# Patient Record
Sex: Female | Born: 1975 | Race: White | Hispanic: Yes | Marital: Married | Smoking: Never smoker
Health system: Southern US, Community
[De-identification: ages and names within clinical notes are randomized; demographics above are authoritative.]

## PROBLEM LIST (undated history)

## (undated) DIAGNOSIS — C642 Malignant neoplasm of left kidney, except renal pelvis: Secondary | ICD-10-CM

## (undated) DIAGNOSIS — T8859XA Other complications of anesthesia, initial encounter: Secondary | ICD-10-CM

## (undated) DIAGNOSIS — T4145XA Adverse effect of unspecified anesthetic, initial encounter: Secondary | ICD-10-CM

## (undated) DIAGNOSIS — D500 Iron deficiency anemia secondary to blood loss (chronic): Secondary | ICD-10-CM

## (undated) DIAGNOSIS — N920 Excessive and frequent menstruation with regular cycle: Secondary | ICD-10-CM

## (undated) HISTORY — DX: Excessive and frequent menstruation with regular cycle: N92.0

## (undated) HISTORY — DX: Iron deficiency anemia secondary to blood loss (chronic): D50.0

## (undated) HISTORY — DX: Malignant neoplasm of left kidney, except renal pelvis: C64.2

---

## 2000-02-20 ENCOUNTER — Encounter: Payer: Self-pay | Admitting: *Deleted

## 2000-02-20 ENCOUNTER — Inpatient Hospital Stay (HOSPITAL_COMMUNITY): Admission: AD | Admit: 2000-02-20 | Discharge: 2000-02-23 | Payer: Self-pay | Admitting: *Deleted

## 2000-03-07 ENCOUNTER — Emergency Department (HOSPITAL_COMMUNITY): Admission: EM | Admit: 2000-03-07 | Discharge: 2000-03-07 | Payer: Self-pay | Admitting: Emergency Medicine

## 2000-03-09 ENCOUNTER — Inpatient Hospital Stay (HOSPITAL_COMMUNITY): Admission: AD | Admit: 2000-03-09 | Discharge: 2000-03-09 | Payer: Self-pay | Admitting: Obstetrics & Gynecology

## 2000-03-12 ENCOUNTER — Inpatient Hospital Stay (HOSPITAL_COMMUNITY): Admission: AD | Admit: 2000-03-12 | Discharge: 2000-03-12 | Payer: Self-pay | Admitting: Obstetrics

## 2000-03-15 ENCOUNTER — Inpatient Hospital Stay (HOSPITAL_COMMUNITY): Admission: AD | Admit: 2000-03-15 | Discharge: 2000-03-15 | Payer: Self-pay | Admitting: *Deleted

## 2000-03-16 ENCOUNTER — Encounter: Admission: RE | Admit: 2000-03-16 | Discharge: 2000-03-16 | Payer: Self-pay | Admitting: *Deleted

## 2001-09-17 ENCOUNTER — Ambulatory Visit (HOSPITAL_COMMUNITY): Admission: RE | Admit: 2001-09-17 | Discharge: 2001-09-17 | Payer: Self-pay | Admitting: *Deleted

## 2001-09-19 ENCOUNTER — Inpatient Hospital Stay (HOSPITAL_COMMUNITY): Admission: AD | Admit: 2001-09-19 | Discharge: 2001-09-19 | Payer: Self-pay | Admitting: *Deleted

## 2001-10-21 ENCOUNTER — Inpatient Hospital Stay (HOSPITAL_COMMUNITY): Admission: AD | Admit: 2001-10-21 | Discharge: 2001-10-22 | Payer: Self-pay | Admitting: Obstetrics and Gynecology

## 2005-07-28 ENCOUNTER — Inpatient Hospital Stay (HOSPITAL_COMMUNITY): Admission: AD | Admit: 2005-07-28 | Discharge: 2005-07-28 | Payer: Self-pay | Admitting: *Deleted

## 2005-12-08 ENCOUNTER — Inpatient Hospital Stay (HOSPITAL_COMMUNITY): Admission: AD | Admit: 2005-12-08 | Discharge: 2005-12-11 | Payer: Self-pay | Admitting: Family Medicine

## 2011-09-04 ENCOUNTER — Other Ambulatory Visit: Payer: Self-pay | Admitting: Family Medicine

## 2011-09-04 DIAGNOSIS — Z3046 Encounter for surveillance of implantable subdermal contraceptive: Secondary | ICD-10-CM

## 2011-09-12 ENCOUNTER — Other Ambulatory Visit: Payer: Self-pay

## 2011-09-12 ENCOUNTER — Ambulatory Visit
Admission: RE | Admit: 2011-09-12 | Discharge: 2011-09-12 | Disposition: A | Discharge status: Home / Self Care | Payer: No Typology Code available for payment source | Source: Ambulatory Visit | Attending: Family Medicine | Admitting: Family Medicine

## 2011-09-12 DIAGNOSIS — Z3046 Encounter for surveillance of implantable subdermal contraceptive: Secondary | ICD-10-CM

## 2016-11-18 ENCOUNTER — Encounter (HOSPITAL_COMMUNITY): Payer: Self-pay | Admitting: *Deleted

## 2016-11-18 ENCOUNTER — Ambulatory Visit: Payer: Self-pay | Admitting: Physician Assistant

## 2016-11-18 ENCOUNTER — Ambulatory Visit (HOSPITAL_COMMUNITY)
Admission: EM | Admit: 2016-11-18 | Discharge: 2016-11-18 | Disposition: A | Discharge status: Home / Self Care | Payer: Self-pay | Attending: Internal Medicine | Admitting: Internal Medicine

## 2016-11-18 DIAGNOSIS — R059 Cough, unspecified: Secondary | ICD-10-CM

## 2016-11-18 DIAGNOSIS — R05 Cough: Secondary | ICD-10-CM

## 2016-11-18 MED ORDER — HYDROCOD POLST-CPM POLST ER 10-8 MG/5ML PO SUER: 5.0000 mL | Freq: Two times a day (BID) | ORAL | 0 refills | Status: AC | PRN

## 2016-11-18 MED ORDER — AZITHROMYCIN 250 MG PO TABS: 250.0000 mg | ORAL_TABLET | Freq: Every day | ORAL | 0 refills | Status: DC

## 2016-11-18 NOTE — ED Provider Notes (Signed)
CSN: [id number]     Arrival date & time 11/18/16  1207 History   First MD Initiated Contact with Patient 11/18/16 1311     Chief Complaint  Patient presents with  . Cough   (Consider location/radiation/quality/duration/timing/severity/associated sxs/prior Treatment) Patient is a 41 year old female, presents today for a cough of one week's duration. Cough is occasionally productive. She also endorses shortness of breath at times. She has occasional chest pain due to coughing. She denies CP at rest. She also denies wheezing, sneezing, congestion, fever, sinus pain, ear pain, sore throat or sick exposure. Patient have try over-the-counter medications with no relief. She reports that she is getting worse.        History reviewed. No pertinent past medical history. Past Surgical History:  Procedure Laterality Date  . CESAREAN SECTION     History reviewed. No pertinent family history. Social History  Substance Use Topics  . Smoking status: Never Smoker  . Smokeless tobacco: Not on file  . Alcohol use No   OB History    No data available     Review of Systems  Constitutional: Positive for fatigue. Negative for chills and fever.  HENT: Negative for congestion, ear pain, rhinorrhea, sinus pain, sinus pressure, sneezing and tinnitus.   Respiratory: Positive for cough and shortness of breath. Negative for chest tightness and wheezing.   Cardiovascular: Positive for chest pain. Negative for palpitations.       Chest pain present during cough  Gastrointestinal: Negative for abdominal pain, diarrhea and nausea.  Neurological: Negative for dizziness and headaches.    Allergies  Patient has no known allergies.  Home Medications   Prior to Admission medications   Medication Sig Start Date End Date Taking? Authorizing Provider  azithromycin (ZITHROMAX) 250 MG tablet Take 1 tablet (250 mg total) by mouth daily. Take first 2 tablets together, then 1 every day until finished. 11/18/16    Barry Dienes, NP  chlorpheniramine-HYDROcodone [facility] PENNKINETIC ER) 10-8 MG/5ML SUER Take 5 mLs by mouth every 12 (twelve) hours as needed for cough. 11/18/16 11/25/16  Barry Dienes, NP   Meds Ordered and Administered this Visit  Medications - No data to display  BP (!) 151/79 (BP Location: Right Arm)   Pulse 75   Temp 98.9 F (37.2 C) (Oral)   Resp 18   LMP 11/18/2016   SpO2 99%  No data found.   Physical Exam  Constitutional: She is oriented to person, place, and time. She appears well-developed and well-nourished.  HENT:  Head: Normocephalic and atraumatic.  Right Ear: External ear normal.  Left Ear: External ear normal.  Nose: Nose normal.  Mouth/Throat: Oropharynx is clear and moist. No oropharyngeal exudate.  TM pearly gray with no erythema  Eyes: Pupils are equal, round, and reactive to light.  Neck: Normal range of motion. Neck supple.  Cardiovascular: Normal rate, regular rhythm and normal heart sounds.   Pulmonary/Chest: Effort normal and breath sounds normal.  Abdominal: Soft. Bowel sounds are normal. There is no tenderness.  Lymphadenopathy:    She has no cervical adenopathy.  Neurological: She is alert and oriented to person, place, and time.  Skin: Skin is warm and dry. No rash noted.  Nursing note and vitals reviewed.   Urgent Care Course     Procedures (including critical care time)  Labs Review Labs Reviewed - No data to display  Imaging Review No results found.   MDM   1. Cough    Most likely viral in etiology but patient  reports that she is getting worst. Will send home with prescription for Tussionex cough syrup and Z-pak to treat empirically. Informed on rest and hydration. Follow up with PCP for no improvement.     Barry Dienes, NP 11/18/16 1326

## 2016-11-18 NOTE — ED Triage Notes (Signed)
Pt reports  Cough  Body    Aches      Pain in  Chest   When  She    Coughs   Pt  Sates   She  Is  In   Good  Health  Takes  No  presription  meds

## 2016-11-18 NOTE — Discharge Instructions (Signed)
Take the medications as prescribed.  A lot of rest and hydration Follow up with your primary care doctor if you do not improve

## 2016-12-04 ENCOUNTER — Encounter: Payer: Self-pay | Admitting: Family Medicine

## 2016-12-04 ENCOUNTER — Ambulatory Visit: Payer: Self-pay | Attending: Family Medicine | Admitting: Family Medicine

## 2016-12-04 VITALS — BP 115/75 | HR 75 | Temp 98.3°F | Ht 66.0 in | Wt 239.4 lb

## 2016-12-04 DIAGNOSIS — R05 Cough: Secondary | ICD-10-CM | POA: Insufficient documentation

## 2016-12-04 DIAGNOSIS — R0982 Postnasal drip: Secondary | ICD-10-CM | POA: Insufficient documentation

## 2016-12-04 DIAGNOSIS — R1110 Vomiting, unspecified: Secondary | ICD-10-CM | POA: Insufficient documentation

## 2016-12-04 DIAGNOSIS — J029 Acute pharyngitis, unspecified: Secondary | ICD-10-CM | POA: Insufficient documentation

## 2016-12-04 MED ORDER — DEXTROMETHORPHAN-GUAIFENESIN 10-100 MG/5ML PO LIQD: 10.0000 mL | Freq: Four times a day (QID) | ORAL | 0 refills | Status: DC | PRN

## 2016-12-04 MED ORDER — CETIRIZINE HCL 10 MG PO TABS: 10.0000 mg | ORAL_TABLET | Freq: Every day | ORAL | 1 refills | Status: DC

## 2016-12-04 NOTE — Progress Notes (Signed)
Subjective:  Patient ID: Deborah Higgins, female    DOB: 1976-05-24  Age: 41 y.o. MRN: [id number]  CC: Cough   HPI Deborah Higgins presents For follow-up visit after seen in the urgent care one and half weeks ago for bronchitis. She completed a course of azithromycin and reports improvement in symptoms. She presents today with cough and posttussive vomiting for the last few days with associated dry and sore throat which is worse at night. She denies sinus tenderness or fevers; denies headaches.  Of note she has also had diarrhea of about 2 times a day for the last few days.   No past medical history on file.  Past Surgical History:  Procedure Laterality Date  . CESAREAN SECTION      No Known Allergies   Outpatient Medications Prior to Visit  Medication Sig Dispense Refill  . azithromycin (ZITHROMAX) 250 MG tablet Take 1 tablet (250 mg total) by mouth daily. Take first 2 tablets together, then 1 every day until finished. (Patient not taking: Reported on 12/04/2016) 6 tablet 0   No facility-administered medications prior to visit.     ROS Review of Systems  Constitutional: Negative for activity change, appetite change and fatigue.  HENT: Positive for sore throat. Negative for congestion and sinus pressure.   Eyes: Negative for visual disturbance.  Respiratory: Positive for cough. Negative for chest tightness, shortness of breath and wheezing.   Cardiovascular: Negative for chest pain and palpitations.  Gastrointestinal: Positive for diarrhea. Negative for abdominal distention, abdominal pain and constipation.  Endocrine: Negative for polydipsia.  Genitourinary: Negative for dysuria and frequency.  Musculoskeletal: Negative for arthralgias and back pain.  Skin: Negative for rash.  Neurological: Negative for tremors, light-headedness and numbness.  Hematological: Does not bruise/bleed easily.  Psychiatric/Behavioral: Negative for agitation and behavioral  problems.    Objective:  BP 115/75   Pulse 75   Temp 98.3 F (36.8 C) (Oral)   Wt 239 lb 6.4 oz (108.6 kg)   LMP 11/18/2016   SpO2 99%   BP/Weight 0/63/0160 1/0/9323  Systolic BP 557 322  Diastolic BP 75 79  Wt. (Lbs) 239.4 -      Physical Exam  Constitutional: She is oriented to person, place, and time. She appears well-developed and well-nourished.  HENT:  Right Ear: External ear normal.  Left Ear: External ear normal.  Postnasal drip in Oropharynx  Cardiovascular: Normal rate, normal heart sounds and intact distal pulses.   No murmur heard. Pulmonary/Chest: Effort normal and breath sounds normal. She has no wheezes. She has no rales. She exhibits no tenderness.  Abdominal: Soft. Bowel sounds are normal. She exhibits no distension and no mass. There is no tenderness.  Musculoskeletal: Normal range of motion.  Neurological: She is alert and oriented to person, place, and time.     Assessment & Plan:   1. Post-nasal drip - cetirizine (ZYRTEC) 10 MG tablet; Take 1 tablet (10 mg total) by mouth daily.  Dispense: 30 tablet; Refill: 1 - dextromethorphan-guaiFENesin (TUSSIN DM) 10-100 MG/5ML liquid; Take 10 mLs by mouth every 6 (six) hours as needed for cough.  Dispense: 240 mL; Refill: 0   Meds ordered this encounter  Medications  . cetirizine (ZYRTEC) 10 MG tablet    Sig: Take 1 tablet (10 mg total) by mouth daily.    Dispense:  30 tablet    Refill:  1  . dextromethorphan-guaiFENesin (TUSSIN DM) 10-100 MG/5ML liquid    Sig: Take 10 mLs by mouth every 6 (six) hours  as needed for cough.    Dispense:  240 mL    Refill:  0    Follow-up: Return if symptoms worsen or fail to improve.   This note has been created with Surveyor, quantity. Any transcriptional errors are unintentional.     Arnoldo Morale MD

## 2016-12-04 NOTE — Patient Instructions (Signed)

## 2016-12-08 ENCOUNTER — Ambulatory Visit: Payer: Self-pay | Attending: Family Medicine

## 2017-03-19 ENCOUNTER — Encounter: Payer: Self-pay | Admitting: Family Medicine

## 2017-05-28 ENCOUNTER — Ambulatory Visit: Payer: Self-pay | Attending: Family Medicine | Admitting: Family Medicine

## 2017-05-28 ENCOUNTER — Encounter: Payer: Self-pay | Admitting: Family Medicine

## 2017-05-28 VITALS — BP 128/78 | HR 72 | Temp 98.2°F | Ht 66.0 in | Wt 238.0 lb

## 2017-05-28 DIAGNOSIS — Z124 Encounter for screening for malignant neoplasm of cervix: Secondary | ICD-10-CM

## 2017-05-28 DIAGNOSIS — Z9889 Other specified postprocedural states: Secondary | ICD-10-CM | POA: Insufficient documentation

## 2017-05-28 DIAGNOSIS — Z0000 Encounter for general adult medical examination without abnormal findings: Secondary | ICD-10-CM

## 2017-05-28 DIAGNOSIS — Z1239 Encounter for other screening for malignant neoplasm of breast: Secondary | ICD-10-CM

## 2017-05-28 DIAGNOSIS — Z13228 Encounter for screening for other metabolic disorders: Secondary | ICD-10-CM

## 2017-05-28 DIAGNOSIS — Z1231 Encounter for screening mammogram for malignant neoplasm of breast: Secondary | ICD-10-CM

## 2017-05-28 NOTE — Patient Instructions (Signed)
[place] (Health Maintenance, Female) Un estilo de vida saludable y los cuidados preventivos pueden favorecer considerablemente a la salud y Musician. Pregunte a su mdico cul es el cronograma de exmenes peridicos apropiado para usted. Esta es una buena oportunidad para consultarlo sobre cmo prevenir enfermedades y [place] sano. Adems de los controles, hay muchas otras cosas que puede hacer usted mismo. Los expertos han realizado numerosas investigaciones [employer] cambios en el estilo de vida y las medidas de prevencin que, [place], lo ayudarn a mantenerse sano. Solicite a su mdico ms informacin. EL PESO Y LA DIETA Consuma una dieta saludable.  Asegrese de [employer] verduras, frutas, productos lcteos de bajo contenido de [place] y Advertising account planner.  No consuma muchos alimentos de alto contenido de grasas slidas, azcares agregados o sal.  Realice actividad fsica con regularidad. Esta es una de las prcticas ms importantes que puede hacer por su salud. ? La Delorise Shiner de los adultos deben hacer ejercicio durante al menos 124mnutos por semana. El ejercicio debe aumentar la frecuencia cardaca y pActorla transpiracin (ejercicio de [place]. ? La mayora de los adultos tambin deben hacer ejercicios de elongacin al mToysRusveces a la semana. Agregue esto al su plan de ejercicio de intensidad moderada. Mantenga un peso saludable.  El ndice de masa corporal ([facility] es una medida que puede utilizarse para identificar posibles problemas de pEast [place]town Proporciona una estimacin de la grasa corporal basndose en el peso y la altura. Su mdico puede ayudarle a dRadiation protection practitionerISouth Endy a lScientist, forensico mTheatre managerun peso saludable.  Para las mujeres de 20aos o ms: ? Un IJohn R. Oishei Children'S Hospitalmenor de 18,5 se considera bajo peso. ? Un ICumberland County Hospitalentre 18,5 y 24,9 es normal. ? Un IPelham Medical Centerentre 25 y 29,9 se considera sobrepeso. ? Un IMC de 30 o ms se considera  obesidad. Observe los niveles de colesterol y lpidos en la sangre.  Debe comenzar a rEnglish as a second language teacherde lpidos y cResearch officer, trade unionen la sangre a los 20aos y luego repetirlos cada 516aos  Es posible que nAutomotive engineerlos niveles de colesterol con mayor frecuencia si: ? Sus niveles de lpidos y colesterol son altos. ? Es mayor de 527CWC ? Presenta un alto riesgo de padecer enfermedades cardacas. DETECCIN DE CNCER Cncer de pulmn  Se recomienda realizar exmenes de deteccin de cncer de pulmn a personas adultas entre 574y 892aos que estn en riesgo de dHorticulturist, commercialde pulmn por sus antecedentes de consumo de tabaco.  Se recomienda una tomografa computarizada de baja dosis de los pulmones todos los aos a las personas que: ? Fuman actualmente. ? Hayan dejado el hbito en algn momento en los ltimos 15aos. ? Hayan fumado durante 30aos un paquete diario. Un paquete-ao equivale a fumar un promedio de un paquete de cigarrillos diario durante un ao.  Los exmenes de deteccin anuales deben continuar hasta que hayan pasado 15aos desde que dej de fumar.  Ya no debern realizarse si tiene un problema de salud que le impida recibir tratamiento para eScience writerde pulmn. Cncer de mama  Practique la autoconciencia de la mama. Esto significa reconocer la apariencia normal de sus mamas y cmo las siente.  Tambin significa realizar autoexmenes regulares de lJohnson & Johnson Informe a su mdico sobre cualquier cambio, sin importar cun pequeo sea.  Si tiene entre 20 y 363aos, un mdico debe realizarle un examen clnico de las mamas como parte del examen regular de s[place] cada 1 a  3aos.  Si tiene 40aos o ms, debe realizarse un examen clnico de las mamas todos los aos. Tambin considere realizarse una radiografa de las mamas (mamografa) todos los aos.  Si tiene antecedentes familiares de cncer de mama, hable con su mdico para someterse a un estudio gentico.  Si  tiene alto riesgo de padecer cncer de mama, hable con su mdico para someterse a una resonancia magntica y una mamografa todos los aos.  La evaluacin del gen del cncer de mama (BRCA) se recomienda a mujeres que tengan familiares con cnceres relacionados con el BRCA. Los cnceres relacionados con el BRCA incluyen los siguientes: ? Mama. ? Ovario. ? Trompas. ? Cnceres de peritoneo.  Los resultados de la evaluacin determinarn la necesidad de asesoramiento gentico y de anlisis de BRCA1 y BRCA2. Cncer de cuello del tero El mdico puede recomendarle que se haga pruebas peridicas de deteccin de cncer de los rganos de la pelvis (ovarios, tero y vagina). Estas pruebas incluyen un examen plvico, que abarca controlar si se produjeron cambios microscpicos en la superficie del cuello del tero (prueba de Papanicolaou). Pueden recomendarle que se haga estas pruebas cada 3aos, a partir de los 21aos.  A las mujeres que tienen entre 30 y 65aos, los mdicos pueden recomendarles que se sometan a exmenes plvicos y pruebas de Papanicolaou cada 3aos, o a la prueba de Papanicolaou y el examen plvico en combinacin con estudios de deteccin del virus del papiloma humano (VPH) cada 5aos. Algunos tipos de VPH aumentan el riesgo de padecer cncer de cuello del tero. La prueba para la deteccin del VPH tambin puede realizarse a mujeres de cualquier edad cuyos resultados de la prueba de Papanicolaou no sean claros.  Es posible que otros mdicos no recomienden exmenes de deteccin a mujeres no embarazadas que se consideran sujetos de bajo riesgo de padecer cncer de pelvis y que no tienen sntomas. Pregntele al mdico si un examen plvico de deteccin es adecuado para usted.  Si ha recibido un tratamiento para el cncer cervical o una enfermedad que podra causar cncer, necesitar realizarse una prueba de Papanicolaou y controles durante al menos 20 aos de concluido el tratamiento. Si no se  ha hecho el Papanicolaou con regularidad, debern volver a evaluarse los factores de riesgo (como tener un nuevo compaero sexual), para determinar si debe realizarse los estudios nuevamente. Algunas mujeres sufren problemas mdicos que aumentan la probabilidad de contraer cncer de cuello del tero. En estos casos, el mdico podr indicar que se realicen controles y pruebas de Papanicolaou con ms frecuencia. Cncer colorrectal  Este tipo de cncer puede detectarse y a menudo prevenirse.  Por lo general, los estudios de rutina se deben comenzar a hacer a partir de los 50 aos y hasta los 75 aos.  Sin embargo, el mdico podr aconsejarle que lo haga antes, si tiene factores de riesgo para el cncer de colon.  Tambin puede recomendarle que use un kit de prueba para hallar sangre oculta en la materia fecal.  Es posible que se use una pequea cmara en el extremo de un tubo para examinar directamente el colon (sigmoidoscopia o colonoscopia) a fin de detectar formas tempranas de cncer colorrectal.  Los exmenes de rutina generalmente comienzan a los 50aos.  El examen directo del colon se debe repetir cada 5 a 10aos hasta los 75aos. Sin embargo, es posible que se realicen exmenes con mayor frecuencia, si se detectan formas tempranas de plipos precancerosos o pequeos bultos. Cncer de piel  Revise la piel   de la cabeza a los pies con regularidad.  Informe a su mdico si aparecen nuevos lunares o los que tiene se modifican, especialmente en su forma y color.  Tambin notifique al mdico si tiene un lunar que es ms grande que el tamao de una goma de lpiz.  Siempre use pantalla solar. Aplique pantalla solar de manera libre y repetida a lo largo del da.  Protjase usando mangas y pantalones largos, un sombrero de ala ancha y gafas para el sol, siempre que se encuentre en el exterior. ENFERMEDADES CARDACAS, DIABETES E HIPERTENSIN ARTERIAL  La hipertensin arterial causa  enfermedades cardacas y aumenta el riesgo de ictus. La hipertensin arterial es ms probable en los siguientes casos: ? Las personas que tienen la presin arterial en el extremo del rango normal (100-139/85-89 mm Hg). ? Las personas con sobrepeso u obesidad. ? Las personas afroamericanas.  Si usted tiene entre 18 y 39 aos, debe medirse la presin arterial cada 3 a 5 aos. Si usted tiene 40 aos o ms, debe medirse la presin arterial todos los aos. Debe medirse la presin arterial dos veces: una vez cuando est en un hospital o una clnica y la otra vez cuando est en otro sitio. Registre el promedio de las dos mediciones. Para controlar su presin arterial cuando no est en un hospital o una clnica, puede usar lo siguiente: ? Una mquina automtica para medir la presin arterial en una farmacia. ? Un monitor para medir la presin arterial en el hogar.  Si tiene entre 55 y 79 aos, consulte a su mdico si debe tomar aspirina para prevenir el ictus.  Realcese exmenes de deteccin de la diabetes con regularidad. Esto incluye la toma de una muestra de sangre para controlar el nivel de azcar en la sangre durante el ayuno. ? Si tiene un peso normal y un bajo riesgo de padecer diabetes, realcese este anlisis cada tres aos despus de los 45aos. ? Si tiene sobrepeso y un alto riesgo de padecer diabetes, considere someterse a este anlisis antes o con mayor frecuencia. PREVENCIN DE INFECCIONES HepatitisB  Si tiene un riesgo ms alto de contraer hepatitis B, debe someterse a un examen de deteccin de este virus. Se considera que tiene un alto riesgo de contraer hepatitis B si: ? Naci en un pas donde la hepatitis B es frecuente. Pregntele a su mdico qu pases son considerados de alto riesgo. ? Sus padres nacieron en un pas de alto riesgo y usted no recibi una vacuna que lo proteja contra la hepatitis B (vacuna contra la hepatitis B). ? Tiene VIH o sida. ? Usa agujas para inyectarse  drogas. ? Vive con alguien que tiene hepatitis B. ? Ha tenido sexo con alguien que tiene hepatitis B. ? Recibe tratamiento de hemodilisis. ? Toma ciertos medicamentos para el cncer, trasplante de rganos y afecciones autoinmunitarias. Hepatitis C  Se recomienda un anlisis de sangre para: ? Todos los que nacieron entre 1945 y 1965. ? Todas las personas que tengan un riesgo de haber contrado hepatitis C. Enfermedades de transmisin sexual (ETS).  Debe realizarse pruebas de deteccin de enfermedades de transmisin sexual (ETS), incluidas gonorrea y clamidia si: ? Es sexualmente activo y es menor de 24aos. ? Es mayor de 24aos, y el mdico le informa que corre riesgo de tener este tipo de infecciones. ? La actividad sexual ha cambiado desde que le hicieron la ltima prueba de deteccin y tiene un riesgo mayor de tener clamidia o gonorrea. Pregntele al mdico si usted   tiene riesgo.  Si no tiene el VIH, pero corre riesgo de infectarse por el virus, se recomienda tomar diariamente un medicamento recetado para evitar la infeccin. Esto se conoce como profilaxis previa a la exposicin. Se considera que est en riesgo si: ? Es [place] sexualmente y no [place] preservativos habitualmente o no conoce el estado del VIH de sus Advertising copywriter. ? Se inyecta drogas. ? Es [place] sexualmente con Ardelia Mems pareja que tiene VIH. Consulte a su mdico para saber si tiene un alto riesgo de infectarse por el VIH. Si opta por comenzar la profilaxis previa a la exposicin, primero debe realizarse anlisis de deteccin del VIH. Luego, le harn anlisis cada 34mses mientras est tomando los medicamentos para la profilaxis previa a la exposicin. [facility] Si es premenopusica y puede quedar e[place] solicite a su mdico asesoramiento previo a la concepcin.  Si puede quedar embarazada, tome 400 a [id number]([id number]) de cido fAnheuser-Busch  Si desea evitar el embarazo, hable con su mdico sobre el  control de la natalidad (anticoncepcin). OSTEOPOROSIS Y MENOPAUSIA  La osteoporosis es una enfermedad en la que los huesos pierden los minerales y la fuerza por el avance de la edad. El resultado pueden ser fracturas graves en los hSay[place] El riesgo de osteoporosis puede identificarse con uArdelia Memsprueba de densidad sea.  Si tiene 65aos o ms, o si est en riesgo de sufrir osteoporosis y fracturas, pregunte a su mdico si debe someterse a exmenes.  Consulte a su mdico si debe tomar un suplemento de calcio o de vitamina D para reducir el riesgo de osteoporosis.  La menopausia puede presentar ciertos sntomas fsicos y rGaffer  La terapia de reemplazo hormonal puede reducir algunos de estos sntomas y rGaffer Consulte a su mdico para saber si la terapia de reemplazo hormonal es conveniente para usted. INSTRUCCIONES PARA EL CUIDADO EN EL HOGAR  Realcese los estudios de rutina de la salud, dentales y de lPublic librarian  MBath  No consuma ningn producto que contenga tabaco, lo que incluye cigarrillos, tabaco de mHigher education careers advisero cPsychologist, sport and exercise  Si est embarazada, no beba alcohol.  Si est amamantando, reduzca el consumo de alcohol y la frecuencia con la que consume.  Si es mujer y no est embarazada limite el consumo de alcohol a no ms de 1 medida por da. Una medida equivale a 12onzas de cerveza, 5onzas de vino o 1onzas de bebidas alcohlicas de alta graduacin.  No consuma drogas.  No comparta agujas.  Solicite ayuda a su mdico si necesita apoyo o informacin para abandonar las drogas.  Informe a su mdico si a menudo se siente deprimido.  Notifique a su mdico si alguna vez ha sido vctima de abuso o si no se siente seguro en su hogar. Esta informacin no tiene cMarine scientistel consejo del mdico. Asegrese de hacerle al mdico cualquier pregunta que tenga. Document Released: 05/18/2011 Document Revised: 06/19/2014 Document Reviewed:  03/02/2015 Elsevier Interactive Patient Education  2[employer]

## 2017-05-28 NOTE — Progress Notes (Signed)
Subjective:  Patient ID: Deborah Higgins, female    DOB: 1975-09-08  Age: 41 y.o. MRN: [id number]  CC: annual exam  HPI Deborah Higgins presents for her annual physical exam.  History reviewed. No pertinent past medical history.  Past Surgical History:  Procedure Laterality Date  . CESAREAN SECTION      No Known Allergies   Outpatient Medications Prior to Visit  Medication Sig Dispense Refill  . cetirizine (ZYRTEC) 10 MG tablet Take 1 tablet (10 mg total) by mouth daily. (Patient not taking: Reported on 05/28/2017) 30 tablet 1  . dextromethorphan-guaiFENesin (TUSSIN DM) 10-100 MG/5ML liquid Take 10 mLs by mouth every 6 (six) hours as needed for cough. (Patient not taking: Reported on 05/28/2017) 240 mL 0   No facility-administered medications prior to visit.     ROS Review of Systems  Constitutional: Negative for activity change, appetite change and fatigue.  HENT: Negative for congestion, sinus pressure and sore throat.   Eyes: Negative for visual disturbance.  Respiratory: Negative for cough, chest tightness, shortness of breath and wheezing.   Cardiovascular: Negative for chest pain and palpitations.  Gastrointestinal: Negative for abdominal distention, abdominal pain and constipation.  Endocrine: Negative for polydipsia.  Genitourinary: Negative for dysuria and frequency.  Musculoskeletal: Negative for arthralgias and back pain.  Skin: Negative for rash.  Neurological: Negative for tremors, light-headedness and numbness.  Hematological: Does not bruise/bleed easily.  Psychiatric/Behavioral: Negative for agitation and behavioral problems.    Objective:  BP 128/78   Pulse 72   Temp 98.2 F (36.8 C) (Oral)   Ht 5' 6" (1.676 m)   Wt 238 lb (108 kg)   LMP 05/12/2017   SpO2 100%   BMI 38.41 kg/m   BP/Weight 05/28/2017 9/79/4801 11/14/5372  Systolic BP 827 078 675  Diastolic BP 78 75 79  Wt. (Lbs) 238 239.4 -  BMI 38.41 38.64 -      Physical  Exam  Constitutional: She is oriented to person, place, and time. She appears well-developed and well-nourished. No distress.  HENT:  Head: Normocephalic.  Right Ear: External ear normal.  Left Ear: External ear normal.  Nose: Nose normal.  Mouth/Throat: Oropharynx is clear and moist.  Eyes: Conjunctivae and EOM are normal. Pupils are equal, round, and reactive to light.  Neck: Normal range of motion. No JVD present.  Cardiovascular: Normal rate, regular rhythm, normal heart sounds and intact distal pulses. Exam reveals no gallop.  No murmur heard. Pulmonary/Chest: Effort normal and breath sounds normal. No respiratory distress. She has no wheezes. She has no rales. She exhibits no tenderness.  Abdominal: Soft. Bowel sounds are normal. She exhibits no distension and no mass. There is no tenderness.  Genitourinary:  Genitourinary Comments: External genitalia, vagina, cervix, adnexa-all normal  Musculoskeletal: Normal range of motion. She exhibits no edema or tenderness.  Neurological: She is alert and oriented to person, place, and time. She has normal reflexes.  Skin: Skin is warm and dry. She is not diaphoretic.  Psychiatric: She has a normal mood and affect.     Assessment & Plan:   1. Annual physical exam Counseled on 150 minutes of exercise every week, healthy eating, routine healthcare maintenance. Declines flu shot - HIV antibody (with reflex)  2. Screening for cervical cancer - Cytology - PAP [place]  3. Screening for breast cancer - MM Digital Screening; Future  4. Screening for metabolic disorder - QGB20+FEOF - Lipid panel   No orders of the defined types were placed in  this encounter.   Follow-up: Return in about 1 year (around 05/28/2018) for Annual physical exam.   Arnoldo Morale MD

## 2017-05-29 LAB — CMP14+EGFR
A/G RATIO: 1.2 (ref 1.2–2.2)
ALT: 9 IU/L (ref 0–32)
AST: 13 IU/L (ref 0–40)
Albumin: 4 g/dL (ref 3.5–5.5)
Alkaline Phosphatase: 79 IU/L (ref 39–117)
BUN/Creatinine Ratio: 20 (ref 9–23)
BUN: 12 mg/dL (ref 6–24)
Bilirubin Total: 0.4 mg/dL (ref 0.0–1.2)
CALCIUM: 8.9 mg/dL (ref 8.7–10.2)
CHLORIDE: 106 mmol/L (ref 96–106)
CO2: 22 mmol/L (ref 20–29)
Creatinine, Ser: 0.59 mg/dL (ref 0.57–1.00)
GFR calc Af Amer: 130 mL/min/{1.73_m2} (ref >59)
GFR, EST NON AFRICAN AMERICAN: 113 mL/min/{1.73_m2} (ref >59)
Globulin, Total: 3.3 g/dL (ref 1.5–4.5)
Glucose: 88 mg/dL (ref 65–99)
POTASSIUM: 4 mmol/L (ref 3.5–5.2)
Sodium: 141 mmol/L (ref 134–144)
Total Protein: 7.3 g/dL (ref 6.0–8.5)

## 2017-05-29 LAB — LIPID PANEL
CHOL/HDL RATIO: 3.6 ratio (ref 0.0–4.4)
Cholesterol, Total: 109 mg/dL (ref 100–199)
HDL: 30 mg/dL — AB (ref >39)
LDL Calculated: 61 mg/dL (ref 0–99)
TRIGLYCERIDES: 91 mg/dL (ref 0–149)
VLDL Cholesterol Cal: 18 mg/dL (ref 5–40)

## 2017-05-29 LAB — CYTOLOGY - PAP
Diagnosis: NEGATIVE
HPV: NOT DETECTED

## 2017-05-29 LAB — HIV ANTIBODY (ROUTINE TESTING W REFLEX): HIV Screen 4th Generation wRfx: NONREACTIVE

## 2017-05-31 ENCOUNTER — Telehealth: Payer: Self-pay

## 2017-05-31 NOTE — Telephone Encounter (Signed)
Pt was called and there is no VM set up to leave a message. 

## 2017-06-14 ENCOUNTER — Inpatient Hospital Stay (HOSPITAL_COMMUNITY)
Admission: EM | Admit: 2017-06-14 | Discharge: 2017-06-16 | DRG: 688 | Disposition: A | Discharge status: Home / Self Care | Payer: Medicaid Other | Attending: Internal Medicine | Admitting: Internal Medicine

## 2017-06-14 ENCOUNTER — Emergency Department (HOSPITAL_COMMUNITY): Payer: Medicaid Other

## 2017-06-14 ENCOUNTER — Other Ambulatory Visit: Payer: Self-pay

## 2017-06-14 ENCOUNTER — Inpatient Hospital Stay (HOSPITAL_COMMUNITY): Payer: Medicaid Other

## 2017-06-14 ENCOUNTER — Encounter (HOSPITAL_COMMUNITY): Payer: Self-pay | Admitting: *Deleted

## 2017-06-14 DIAGNOSIS — R197 Diarrhea, unspecified: Secondary | ICD-10-CM

## 2017-06-14 DIAGNOSIS — N2889 Other specified disorders of kidney and ureter: Secondary | ICD-10-CM | POA: Diagnosis present

## 2017-06-14 DIAGNOSIS — Z23 Encounter for immunization: Secondary | ICD-10-CM

## 2017-06-14 DIAGNOSIS — C642 Malignant neoplasm of left kidney, except renal pelvis: Principal | ICD-10-CM | POA: Diagnosis present

## 2017-06-14 DIAGNOSIS — D75839 Thrombocytosis, unspecified: Secondary | ICD-10-CM | POA: Diagnosis present

## 2017-06-14 DIAGNOSIS — R300 Dysuria: Secondary | ICD-10-CM | POA: Diagnosis present

## 2017-06-14 DIAGNOSIS — D473 Essential (hemorrhagic) thrombocythemia: Secondary | ICD-10-CM | POA: Diagnosis present

## 2017-06-14 DIAGNOSIS — D509 Iron deficiency anemia, unspecified: Secondary | ICD-10-CM | POA: Diagnosis present

## 2017-06-14 DIAGNOSIS — N920 Excessive and frequent menstruation with regular cycle: Secondary | ICD-10-CM | POA: Diagnosis present

## 2017-06-14 DIAGNOSIS — R112 Nausea with vomiting, unspecified: Secondary | ICD-10-CM | POA: Diagnosis present

## 2017-06-14 DIAGNOSIS — D72829 Elevated white blood cell count, unspecified: Secondary | ICD-10-CM | POA: Diagnosis present

## 2017-06-14 DIAGNOSIS — A084 Viral intestinal infection, unspecified: Secondary | ICD-10-CM | POA: Diagnosis present

## 2017-06-14 DIAGNOSIS — M545 Low back pain: Secondary | ICD-10-CM

## 2017-06-14 DIAGNOSIS — D649 Anemia, unspecified: Secondary | ICD-10-CM

## 2017-06-14 LAB — URINALYSIS, ROUTINE W REFLEX MICROSCOPIC
Bilirubin Urine: NEGATIVE
Bilirubin Urine: NEGATIVE
Glucose, UA: NEGATIVE mg/dL
Glucose, UA: NEGATIVE mg/dL
KETONES UR: NEGATIVE mg/dL
Ketones, ur: NEGATIVE mg/dL
Nitrite: NEGATIVE
Nitrite: NEGATIVE
PH: 5 (ref 5.0–8.0)
PROTEIN: 30 mg/dL — AB
Protein, ur: NEGATIVE mg/dL
Specific Gravity, Urine: 1.026 (ref 1.005–1.030)
Specific Gravity, Urine: 1.024 (ref 1.005–1.030)
pH: 5 (ref 5.0–8.0)

## 2017-06-14 LAB — CBC
HEMATOCRIT: 30.5 % — AB (ref 36.0–46.0)
HEMOGLOBIN: 7.9 g/dL — AB (ref 12.0–15.0)
MCH: 15.3 pg — AB (ref 26.0–34.0)
MCHC: 25.9 g/dL — AB (ref 30.0–36.0)
MCV: 59.1 fL — AB (ref 78.0–100.0)
Platelets: 618 10*3/uL — ABNORMAL HIGH (ref 150–400)
RBC: 5.16 MIL/uL — AB (ref 3.87–5.11)
RDW: 19.5 % — ABNORMAL HIGH (ref 11.5–15.5)
WBC: 16.2 10*3/uL — ABNORMAL HIGH (ref 4.0–10.5)

## 2017-06-14 LAB — DIFFERENTIAL
BASOS ABS: 0 10*3/uL (ref 0.0–0.1)
Basophils Relative: 0 %
EOS ABS: 0 10*3/uL (ref 0.0–0.7)
Eosinophils Relative: 0 %
LYMPHS PCT: 10 %
Lymphs Abs: 1.6 10*3/uL (ref 0.7–4.0)
MONO ABS: 0.5 10*3/uL (ref 0.1–1.0)
Monocytes Relative: 3 %
Neutro Abs: 14.1 10*3/uL — ABNORMAL HIGH (ref 1.7–7.7)
Neutrophils Relative %: 87 %

## 2017-06-14 LAB — COMPREHENSIVE METABOLIC PANEL
ALBUMIN: 4 g/dL (ref 3.5–5.0)
ALK PHOS: 86 U/L (ref 38–126)
ALT: 12 U/L — ABNORMAL LOW (ref 14–54)
ANION GAP: 10 (ref 5–15)
AST: 18 U/L (ref 15–41)
BUN: 20 mg/dL (ref 6–20)
CHLORIDE: 104 mmol/L (ref 101–111)
CO2: 21 mmol/L — AB (ref 22–32)
Calcium: 9.3 mg/dL (ref 8.9–10.3)
Creatinine, Ser: 0.8 mg/dL (ref 0.44–1.00)
GFR calc non Af Amer: >60 mL/min (ref >60)
GLUCOSE: 142 mg/dL — AB (ref 65–99)
POTASSIUM: 3.5 mmol/L (ref 3.5–5.1)
SODIUM: 135 mmol/L (ref 135–145)
Total Bilirubin: 0.4 mg/dL (ref 0.3–1.2)
Total Protein: 8.1 g/dL (ref 6.5–8.1)

## 2017-06-14 LAB — I-STAT BETA HCG BLOOD, ED (MC, WL, AP ONLY): I-stat hCG, quantitative: <5 m[IU]/mL (ref <5)

## 2017-06-14 LAB — IRON AND TIBC
Iron: 7 ug/dL — ABNORMAL LOW (ref 28–170)
Saturation Ratios: 1 % — ABNORMAL LOW (ref 10.4–31.8)
TIBC: 468 ug/dL — ABNORMAL HIGH (ref 250–450)
UIBC: 461 ug/dL

## 2017-06-14 LAB — LIPASE, BLOOD: LIPASE: 27 U/L (ref 11–51)

## 2017-06-14 LAB — FERRITIN: Ferritin: 4 ng/mL — ABNORMAL LOW (ref 11–307)

## 2017-06-14 MED ORDER — ONDANSETRON HCL 4 MG/2ML IJ SOLN
4.0000 mg | Freq: Once | INTRAMUSCULAR | Status: AC
  Administered 2017-06-14: 4 mg via INTRAVENOUS
  Filled 2017-06-14: qty 2

## 2017-06-14 MED ORDER — ONDANSETRON HCL 4 MG PO TABS: 4.0000 mg | ORAL_TABLET | Freq: Four times a day (QID) | ORAL | Status: DC | PRN

## 2017-06-14 MED ORDER — FOSFOMYCIN TROMETHAMINE 3 G PO PACK
3.0000 g | PACK | Freq: Once | ORAL | Status: AC
  Administered 2017-06-15: 3 g via ORAL
  Filled 2017-06-14: qty 3

## 2017-06-14 MED ORDER — LORAZEPAM 2 MG/ML IJ SOLN
1.0000 mg | Freq: Once | INTRAMUSCULAR | Status: AC
  Administered 2017-06-14: 1 mg via INTRAVENOUS
  Filled 2017-06-14: qty 1

## 2017-06-14 MED ORDER — SODIUM CHLORIDE 0.9% FLUSH: 3.0000 mL | INTRAVENOUS | Status: DC | PRN

## 2017-06-14 MED ORDER — SODIUM CHLORIDE 0.9% FLUSH
3.0000 mL | Freq: Two times a day (BID) | INTRAVENOUS | Status: DC
  Administered 2017-06-14 – 2017-06-16 (×2): 3 mL via INTRAVENOUS

## 2017-06-14 MED ORDER — ONDANSETRON 4 MG PO TBDP
8.0000 mg | ORAL_TABLET | Freq: Once | ORAL | Status: AC
  Administered 2017-06-14: 8 mg via ORAL

## 2017-06-14 MED ORDER — INFLUENZA VAC SPLIT QUAD 0.5 ML IM SUSY
0.5000 mL | PREFILLED_SYRINGE | INTRAMUSCULAR | Status: AC
  Administered 2017-06-15: 0.5 mL via INTRAMUSCULAR
  Filled 2017-06-14: qty 0.5

## 2017-06-14 MED ORDER — SODIUM CHLORIDE 0.9 % IV BOLUS (SEPSIS)
1000.0000 mL | Freq: Once | INTRAVENOUS | Status: AC
  Administered 2017-06-14: 1000 mL via INTRAVENOUS

## 2017-06-14 MED ORDER — ONDANSETRON HCL 4 MG/2ML IJ SOLN: 4.0000 mg | Freq: Four times a day (QID) | INTRAMUSCULAR | Status: DC | PRN

## 2017-06-14 MED ORDER — ACETAMINOPHEN 325 MG PO TABS
650.0000 mg | ORAL_TABLET | Freq: Once | ORAL | Status: AC
Start: 2017-06-14 — End: 2017-06-14
  Administered 2017-06-14: 650 mg via ORAL
  Filled 2017-06-14: qty 2

## 2017-06-14 MED ORDER — SODIUM CHLORIDE 0.9 % IV SOLN: 250.0000 mL | INTRAVENOUS | Status: DC | PRN

## 2017-06-14 MED ORDER — ENOXAPARIN SODIUM 40 MG/0.4ML ~~LOC~~ SOLN
40.0000 mg | SUBCUTANEOUS | Status: DC
  Administered 2017-06-14 – 2017-06-16 (×3): 40 mg via SUBCUTANEOUS
  Filled 2017-06-14 (×2): qty 0.4

## 2017-06-14 MED ORDER — GADOBENATE DIMEGLUMINE 529 MG/ML IV SOLN
20.0000 mL | Freq: Once | INTRAVENOUS | Status: AC | PRN
  Administered 2017-06-14: 20 mL via INTRAVENOUS

## 2017-06-14 NOTE — ED Provider Notes (Signed)
Deborah Higgins [facility] Provider Note   CSN: [id number] Arrival date & time: 06/14/17  0200     History   Chief Complaint Chief Complaint  Patient presents with  . Abdominal Pain    HPI Deborah Higgins is a 42 y.o. female who presents to the emergency department with a chief complaint of abdominal pain with associated dysuria, nausea, vomiting, and diarrhea that began suddenly around midnight. She reports 5-10 episodes of NBNB emesis and diarrhea.  She reports worsening burning and pressure-like dysuria over the last 3-4 days.  She denies fever, chills, dyspnea, back pain, chest pain, lightheadedness, dizziness, vaginal bleeding, itching, pain or discharge.   She endorses intermittent, achy abdominal pain that is only present when she feels the urge to have an episode of diarrhea.  No other alleviating factors.   LMP was 12/25-29. She reports an average of 3 pads daily during her last few menstrual cycles. No recent changes that she has noticed to the length of heaviness of her menstrual cycle.  She states that she was previously told that she has anemia by another doctor.  She was seen by her PCP in December and had a Pap smear and was tested for HIV, but no CBC was performed.  No sick contacts. Surgical hx includes cesarean section.  The history is provided by the patient and a relative. A language interpreter was used.    History reviewed. No pertinent past medical history.  Patient Active Problem List   Diagnosis Date Noted  . Mass of left kidney 06/14/2017  . Microcytic anemia 06/14/2017  . Leukocytosis 06/14/2017  . Thrombocytosis ([place]) 06/14/2017  . Nausea vomiting and diarrhea 06/14/2017    Past Surgical History:  Procedure Laterality Date  . CESAREAN SECTION      OB History    No data available       Home Medications    Prior to Admission medications   Not on File    Family History No family history on file.  Social History Social  History   Tobacco Use  . Smoking status: Never Smoker  . Smokeless tobacco: Never Used  Substance Use Topics  . Alcohol use: No  . Drug use: No     Allergies   Patient has no known allergies.   Review of Systems Review of Systems  Constitutional: Negative for activity change, chills and fever.  HENT: Negative for congestion.   Respiratory: Negative for shortness of breath.   Cardiovascular: Negative for chest pain.  Gastrointestinal: Positive for abdominal pain, diarrhea, nausea and vomiting.  Genitourinary: Positive for dysuria. Negative for decreased urine volume, urgency, vaginal discharge and vaginal pain.  Musculoskeletal: Negative for back pain, neck pain and neck stiffness.  Skin: Negative for rash.  Neurological: Negative for dizziness, syncope, weakness and headaches.  Psychiatric/Behavioral: Negative for confusion.   Physical Exam Updated Vital Signs BP (!) 115/44   Pulse 67   Temp 98.4 F (36.9 C) (Oral)   Resp 16   Ht 5\' 6"  (1.676 m)   Wt 108 kg (238 lb)   LMP 05/14/2017   SpO2 98%   BMI 38.41 kg/m   Physical Exam  Constitutional: No distress.  Obese abdomen.  HENT:  Head: Normocephalic.  Eyes: Conjunctivae are normal.  Neck: Neck supple.  Cardiovascular: Normal rate and regular rhythm. Exam reveals no gallop and no friction rub.  No murmur heard. Pulmonary/Chest: Effort normal and breath sounds normal. No stridor. No respiratory distress. She has no wheezes. She  has no rhonchi. She has no rales. She exhibits no tenderness.  Abdominal: Soft. Bowel sounds are normal. She exhibits no shifting dullness, no distension, no pulsatile liver, no fluid wave, no abdominal bruit, no ascites, no pulsatile midline mass and no mass. There is no hepatosplenomegaly, splenomegaly or hepatomegaly. There is generalized tenderness. There is no rigidity, no rebound, no guarding, no CVA tenderness, no tenderness at McBurney's point and negative Murphy's sign. No hernia.    Mild generalized tenderness to palpation to the abdomen.  Left-sided CVA tenderness.  No right CVA tenderness.  Negative Murphy sign.  No tenderness over McBurney's point.  No peritoneal signs.  Musculoskeletal: Normal range of motion. She exhibits no edema, tenderness or deformity.  Neurological: She is alert.  Skin: Skin is warm. Capillary refill takes less than 2 seconds. No rash noted.  Psychiatric: Her behavior is normal.  Nursing note and vitals reviewed.  ED Treatments / Results  Labs (all labs ordered are listed, but only abnormal results are displayed) Labs Reviewed  COMPREHENSIVE METABOLIC PANEL - Abnormal; Notable for the following components:      Result Value   CO2 21 (*)    Glucose, Bld 142 (*)    ALT 12 (*)    All other components within normal limits  CBC - Abnormal; Notable for the following components:   WBC 16.2 (*)    RBC 5.16 (*)    Hemoglobin 7.9 (*)    HCT 30.5 (*)    MCV 59.1 (*)    MCH 15.3 (*)    MCHC 25.9 (*)    RDW 19.5 (*)    Platelets 618 (*)    All other components within normal limits  URINALYSIS, ROUTINE W REFLEX MICROSCOPIC - Abnormal; Notable for the following components:   APPearance HAZY (*)    Hgb urine dipstick SMALL (*)    Protein, ur 30 (*)    Leukocytes, UA MODERATE (*)    Bacteria, UA RARE (*)    Squamous Epithelial / LPF 6-30 (*)    All other components within normal limits  URINALYSIS, ROUTINE W REFLEX MICROSCOPIC - Abnormal; Notable for the following components:   APPearance HAZY (*)    Hgb urine dipstick SMALL (*)    Leukocytes, UA TRACE (*)    Bacteria, UA RARE (*)    Squamous Epithelial / LPF 0-5 (*)    All other components within normal limits  DIFFERENTIAL - Abnormal; Notable for the following components:   Neutro Abs 14.1 (*)    All other components within normal limits  LIPASE, BLOOD  CBC  FERRITIN  IRON AND TIBC  I-STAT BETA HCG BLOOD, ED ([facility], [facility], [facility] ONLY)    EKG  EKG Interpretation None        Radiology US Renal  Result Date: 06/14/2017 CLINICAL DATA:  Left renal mass seen on CT EXAM: RENAL / URINARY TRACT ULTRASOUND COMPLETE COMPARISON:  CT 06/14/2017 FINDINGS: Right Kidney: Length: 12.2 cm. Echogenicity within normal limits. No mass or hydronephrosis visualized. Left Kidney: Length: 12.8 cm. Normal echotexture. No hydronephrosis. Complex cystic mass in the lower pole measures 4.1 x 3.4 x 3.5 cm. Thick internal septations noted. Bladder: Appears normal for degree of bladder distention. IMPRESSION: Complex cystic mass in the lower pole of the left kidney with thick internal septations a possible solid components. Cannot exclude cystic renal neoplasm. This requires further evaluation. Recommend renal MRI with and without contrast. Electronically Signed   By: Rolm Baptise M.D.   On: 06/14/2017  09:47   Ct Renal Stone Study  Result Date: 06/14/2017 CLINICAL DATA:  Acute lower abdominal pain, nausea, vomiting. EXAM: CT ABDOMEN AND PELVIS WITHOUT CONTRAST TECHNIQUE: Multidetector CT imaging of the abdomen and pelvis was performed following the standard protocol without IV contrast. COMPARISON:  None. FINDINGS: Lower chest: No acute abnormality. Hepatobiliary: No focal liver abnormality is seen. No gallstones, gallbladder wall thickening, or biliary dilatation. Pancreas: Unremarkable. No pancreatic ductal dilatation or surrounding inflammatory changes. Spleen: Normal in size without focal abnormality. Adrenals/Urinary Tract: Adrenal glands appear normal. No hydronephrosis or renal obstruction is noted. No renal or ureteral calculi are noted. Urinary bladder is unremarkable. 2.4 cm exophytic rounded abnormality is seen arising medially from lower pole of left kidney with average Hounsfield measurement of 22. It is uncertain if this represents hyperdense cyst or mass. Right kidney is unremarkable on this unenhanced image. Stomach/Bowel: Stomach is within normal limits. Appendix appears normal. No  evidence of bowel wall thickening, distention, or inflammatory changes. Vascular/Lymphatic: No significant vascular findings are present. No enlarged abdominal or pelvic lymph nodes. Reproductive: Probable enlarged fibroid uterus is noted. No adnexal abnormality is noted. Other: No abdominal wall hernia or abnormality. No abdominopelvic ascites. Musculoskeletal: No acute or significant osseous findings. IMPRESSION: No hydronephrosis or renal obstruction is noted. No renal or ureteral calculi are noted. 2.4 cm exophytic rounded abnormality is seen arising medially from lower pole of left kidney. It is uncertain if this represents hyperdense cyst or possible neoplasm. Further evaluation with ultrasound is recommended to rule out malignancy. Probable enlarged fibroid uterus. Electronically Signed   By: Marijo Conception, M.D.   On: 06/14/2017 08:32    Procedures Procedures (including critical care time)  Medications Ordered in ED Medications  enoxaparin (LOVENOX) injection 40 mg (not administered)  sodium chloride flush (NS) 0.9 % injection 3 mL (not administered)  sodium chloride flush (NS) 0.9 % injection 3 mL (not administered)  0.9 %  sodium chloride infusion (not administered)  ondansetron (ZOFRAN) tablet 4 mg (not administered)    Or  ondansetron (ZOFRAN) injection 4 mg (not administered)  ondansetron (ZOFRAN-ODT) disintegrating tablet 8 mg (8 mg Oral Given 06/14/17 0234)  sodium chloride 0.9 % bolus 1,000 mL (0 mLs Intravenous Stopped 06/14/17 1618)  LORazepam (ATIVAN) injection 1 mg (1 mg Intravenous Given 06/14/17 1156)  ondansetron (ZOFRAN) injection 4 mg (4 mg Intravenous Given 06/14/17 1155)  acetaminophen (TYLENOL) tablet 650 mg (650 mg Oral Given 06/14/17 1156)  gadobenate dimeglumine (MULTIHANCE) injection 20 mL (20 mLs Intravenous Contrast Given 06/14/17 1715)     Initial Impression / Assessment and Plan / ED Course  I have reviewed the triage vital signs and the nursing notes.  Pertinent  labs & imaging results that were available during my care of the patient were reviewed by me and considered in my medical decision making (see chart for details).     42 year old female presenting with a h/o of nephrolithiasis with NBNB emesis, nausea, diarrhea, abdominal pain that began last night and dysuria over the last week.  On physical exam, she has CVA tenderness, but the remainder of her abdominal exam is unremarkable. UA with hgb, but otherwise appears contaminated. CT stone study ordered to access for nephrolithiasis demonstrating a 2.4 cm exophytic rounded abnormality arisign from the lower pole of the left kidney. Radiology recommended further evaluation with [place] for concern of hyperdense cyst or possible neoplasms. [place] was unable to provide further evaluation and radiology recommended MRI with and without contrast.   CBC  with several hematologic abnormalities including: leukocytosis of 16.2, hgb 7.9, and thrombocytosis of 618. MCV 59.1 No CBC on file to compare with previous. Spoke at length with the patient using a [place] [facility] interpreter. She does endorse some worsening fatigue and mild dyspnea on exertion over the last month, concerning for symptomatic anemia. No recent change in the patient's menstrual cycle, which does not necessary explain the patient's hgb of 7.9- her cycle lasts approximately ~4 days with 3 pads per day.   She also has a thrombocytosis, possibly related to N/V/D if viral etiology, but I question if this elevation would be this high with onset in the last 12 hours. Thrombocytosis could be secondary to malignancy. There is also concern with this patient regarding follow up and care since the patient does not currently have health insurance and is followed at [facility].   The patient was discussed with Dr. Jeanell Sparrow, attending physician. Spoke with resident Charlynn Grimes with the internal medicine team who will admit the patient for further workup and evalation. Requested  differential to be added to CBC and MR order to be placed, which I have added. The patient appears reasonably stabilized for admission considering the current resources, flow, and capabilities available in the ED at this time, and I doubt any other [facility] requiring further screening and/or treatment in the ED prior to admission.   Final Clinical Impressions(s) / ED Diagnoses   Final diagnoses:  Symptomatic anemia  Left kidney mass    ED Discharge Orders    None       Joanne Gavel, PA-C 06/14/17 1823    Pattricia Boss, MD 06/15/17 1659

## 2017-06-14 NOTE — Progress Notes (Signed)
Report received from the ED. Pt arrived to the unit at 1802 via bed.

## 2017-06-14 NOTE — ED Notes (Signed)
Patient transported to Ultrasound 

## 2017-06-14 NOTE — ED Notes (Signed)
Attempted report 

## 2017-06-14 NOTE — ED Triage Notes (Signed)
The pt is c/o abd pain with n v and diarhea for 2 hours  lmp last month

## 2017-06-14 NOTE — H&P (Signed)
Date: 06/14/2017               Patient Name:  Deborah Higgins MRN: [id number]  DOB: 11-Sep-1975 Age / Sex: 42 y.o., female   PCP: Arnoldo Morale, MD              Medical Service: Internal Medicine Teaching Service              Attending Physician: Dr. Oda Kilts, MD    First Contact: Annye Asa, MS4 Pager: [phone]  Second Contact: Dr. Maryellen Pile Pager: [phone]            After Hours (After 5p/  First Contact Pager: [phone]  weekends / holidays): Second Contact Pager: [phone]   Chief Complaint: Vomiting & Diarrhea  History of Present Illness:  Pt is a 42yo Spanish speaking female, otherwise healthy, who presented to the ED with sudden onset of vomiting and diarrhea. Around midnight, she reports feeling full and nauseous. She then proceeded to have 4-5 episodes of nonbloody vomiting that resembled the food that she ate earlier. At the same time, she had watery, nonbloody diarrhea. She also experienced a sensation of dyuria and chills during that time. All of those symptoms have resolved shortly after arriving at the ED and receiving 2 doses of Zofran. She denies any fever, sick contacts, prior bloody BM's, hematuria, vaginal discharge, vaginal itching, urinary frequency, or urinary urgency.   Pt reports having heavy periods for 7 days at atime, using 5-6 pads/day. LMP was on 06/05/17. She is currently having some lower back pain that began 2 weeks ago and has not resolved with Tylenol today. She remembers having a sudden weight loss of 10-15 lbs, but she has been stable for the last 7 months. She also reports having chronic SOB and chest pain of mild severity.   Meds: No OTC or prescribed medications.  Allergies: No known drug allergies  Past MedHx: None. No hx of UTI's.  Past SurgHx: - C-section x 1  Social History: Negative for smoking cigarettes, alcohol, or illicit drug use.    Physical Exam: BP 120/66 (BP Location: Right Arm)   Pulse 60   Temp  98.5 F (36.9 C) (Oral)   Resp 16   Ht 5' 6"  (1.676 m)   Wt 108 kg (238 lb)   LMP 05/14/2017   SpO2 100%   BMI 38.41 kg/m    General Appearance:    Awake, appears mildly uncomfortable lying in bed.   Head:    Normocephalic, without obvious abnormality, atraumatic  Eyes:    PERRL, no conjunctival pallor, EOM's intact,  Throat:   Lips, mucosa, and tongue normal; teeth and gums normal  Neck:   Supple, symmetrical, trachea midline, 2-cm rubbery mobile mass that is nontender and located on L lateral aspect of neck  Back:     Symmetric, no curvature, ROM normal, no CVA tenderness  Lungs:     Clear to auscultation bilaterally, respirations unlabored  Chest Wall:    No tenderness or deformity   Heart:    Regular rate and rhythm, S1 and S2 normal, no murmur, rub   or gallop  Abdomen:     Soft, non-tender, bowel sounds active all four quadrants,    no masses, no organomegaly  Pulses  2+ and symmetric all extremities  Skin  Skin color, texture, turgor normal, no rashes or lesions  Lymph nodes  Cervical, inguinal, and axillary nodes normal        Lab results: WBC -  16.2 (H) w/ 14.1 Neutrophiils (H) , Hgb/Hct - 7.9/30.5 (L), MCV - 59.1 (L), Plt - 618 (H)  Na - 135, K - 3.5, Cl - 104, CO2 - 21, Cr - 0.80, ALT/AST - 12/18, Alk Phos - 86, T. Bili - 0.4  Lipase - 27  U/A WBCs - Moderate, Nitrites - Negative, Bacteria - Rare, Hgb - small, Protein - 30, Squamous Epithelial - 6-30  Urine Pregnancy - negative    Imaging results:  US Renal  Result Date: 06/14/2017 CLINICAL DATA:  Left renal mass seen on CT EXAM: RENAL / URINARY TRACT ULTRASOUND COMPLETE COMPARISON:  CT 06/14/2017 FINDINGS: Right Kidney: Length: 12.2 cm. Echogenicity within normal limits. No mass or hydronephrosis visualized. Left Kidney: Length: 12.8 cm. Normal echotexture. No hydronephrosis. Complex cystic mass in the lower pole measures 4.1 x 3.4 x 3.5 cm. Thick internal septations noted. Bladder: Appears normal for degree of  bladder distention. IMPRESSION: Complex cystic mass in the lower pole of the left kidney with thick internal septations a possible solid components. Cannot exclude cystic renal neoplasm. This requires further evaluation. Recommend renal MRI with and without contrast. Electronically Signed   By: Rolm Baptise M.D.   On: 06/14/2017 09:47   Ct Renal Stone Study  Result Date: 06/14/2017 CLINICAL DATA:  Acute lower abdominal pain, nausea, vomiting. EXAM: CT ABDOMEN AND PELVIS WITHOUT CONTRAST TECHNIQUE: Multidetector CT imaging of the abdomen and pelvis was performed following the standard protocol without IV contrast. COMPARISON:  None. FINDINGS: Lower chest: No acute abnormality. Hepatobiliary: No focal liver abnormality is seen. No gallstones, gallbladder wall thickening, or biliary dilatation. Pancreas: Unremarkable. No pancreatic ductal dilatation or surrounding inflammatory changes. Spleen: Normal in size without focal abnormality. Adrenals/Urinary Tract: Adrenal glands appear normal. No hydronephrosis or renal obstruction is noted. No renal or ureteral calculi are noted. Urinary bladder is unremarkable. 2.4 cm exophytic rounded abnormality is seen arising medially from lower pole of left kidney with average Hounsfield measurement of 22. It is uncertain if this represents hyperdense cyst or mass. Right kidney is unremarkable on this unenhanced image. Stomach/Bowel: Stomach is within normal limits. Appendix appears normal. No evidence of bowel wall thickening, distention, or inflammatory changes. Vascular/Lymphatic: No significant vascular findings are present. No enlarged abdominal or pelvic lymph nodes. Reproductive: Probable enlarged fibroid uterus is noted. No adnexal abnormality is noted. Other: No abdominal wall hernia or abnormality. No abdominopelvic ascites. Musculoskeletal: No acute or significant osseous findings. IMPRESSION: No hydronephrosis or renal obstruction is noted. No renal or ureteral calculi  are noted. 2.4 cm exophytic rounded abnormality is seen arising medially from lower pole of left kidney. It is uncertain if this represents hyperdense cyst or possible neoplasm. Further evaluation with ultrasound is recommended to rule out malignancy. Probable enlarged fibroid uterus. Electronically Signed   By: Marijo Conception, M.D.   On: 06/14/2017 08:32    EKG: Normal sinus rhythm and rate.  Assessment & Plan by Problem:   Mass of left kidney - complex cyst (3x3x4 cm) incidentally visualized on CT and U/S - MRI pending for further delineation - possible history of unintentional wt loss (~15 lbs) before June - Will await MRI results to determine the need for biopsy     Microcytic anemia - history of menorrhagia (7 days of using 6 pads/day) - Fe studies pending - Consider thalassemia and Hgb electrophoresis if Fe studies negative    Leukocytosis - WBC - 16  - Likely secondary to gastroenteritis vs. UTI  - Will rpt  CBC in the morning and rpt U/A    Thrombocytosis (HCC) - Reactionary to gastroenteritis vs secondary to Fe deficiency anemia    Nausea vomiting and diarrhea - Acute onset - Likely gastroenteritis due to limited course of symptoms - Less likely pyelonephritis  - Symptomatically resolved with Zofran   Dysuria - Rpt U/A   This is a Careers information officer Note.  The care of the patient was discussed with Dr. Charlynn Grimes and the assessment and plan was formulated with their assistance.  Please see their note for official documentation of the patient encounter.   Signed: Annye Asa, Medical Student 06/14/2017, 3:01 PM   Attestation for Student Documentation:  I personally was present and performed or re-performed the history, physical exam and medical decision-making activities of this service and have verified that the service and findings are accurately documented in the student's note.  Maryellen Pile, MD 06/27/2017, 1:32 PM

## 2017-06-14 NOTE — ED Notes (Signed)
Pt to MRI at this time.

## 2017-06-14 NOTE — Progress Notes (Signed)
Deborah Higgins is a 42 y.o. female patient admitted from ED awake, alert - oriented  X 4 - no acute distress noted.  VSS - Blood pressure (!) 115/44, pulse 67, temperature 98.4 F (36.9 C), temperature source Oral, resp. rate 16, height 5\' 6"  (1.676 m), weight 108 kg (238 lb), last menstrual period 05/14/2017, SpO2 98 %.    IV in place, occlusive dsg intact without redness.  Orientation to room, and floor completed with information packet given to patient/family.  Patient declined safety video at this time.  Admission INP armband ID verified with patient/family, and in place.   SR up x 2, fall assessment complete, with patient and family able to verbalize understanding of risk associated with falls, and verbalized understanding to call nsg before up out of bed.  Call light within reach, patient able to voice, and demonstrate understanding.  Skin, clean-dry- intact without evidence of bruising, or skin tears.   No evidence of skin break down noted on exam.     Will cont to eval and treat per MD orders.  Dorris Carnes, RN 06/14/2017 6:12 PM

## 2017-06-14 NOTE — ED Notes (Signed)
ED Provider at bedside. 

## 2017-06-15 ENCOUNTER — Inpatient Hospital Stay (HOSPITAL_COMMUNITY): Payer: Medicaid Other

## 2017-06-15 DIAGNOSIS — N920 Excessive and frequent menstruation with regular cycle: Secondary | ICD-10-CM

## 2017-06-15 DIAGNOSIS — R197 Diarrhea, unspecified: Secondary | ICD-10-CM

## 2017-06-15 LAB — PREPARE RBC (CROSSMATCH)

## 2017-06-15 LAB — CBC
HCT: 24.7 % — ABNORMAL LOW (ref 36.0–46.0)
HCT: 28.5 % — ABNORMAL LOW (ref 36.0–46.0)
Hemoglobin: 6.3 g/dL — CL (ref 12.0–15.0)
Hemoglobin: 8 g/dL — ABNORMAL LOW (ref 12.0–15.0)
MCH: 15 pg — ABNORMAL LOW (ref 26.0–34.0)
MCH: 17.2 pg — ABNORMAL LOW (ref 26.0–34.0)
MCHC: 25.5 g/dL — ABNORMAL LOW (ref 30.0–36.0)
MCHC: 28.1 g/dL — ABNORMAL LOW (ref 30.0–36.0)
MCV: 58.7 fL — ABNORMAL LOW (ref 78.0–100.0)
MCV: 61.4 fL — ABNORMAL LOW (ref 78.0–100.0)
Platelets: 517 10*3/uL — ABNORMAL HIGH (ref 150–400)
Platelets: 520 10*3/uL — ABNORMAL HIGH (ref 150–400)
RBC: 4.21 MIL/uL (ref 3.87–5.11)
RBC: 4.64 MIL/uL (ref 3.87–5.11)
RDW: 18.9 % — ABNORMAL HIGH (ref 11.5–15.5)
RDW: 22.2 % — ABNORMAL HIGH (ref 11.5–15.5)
WBC: 8.3 10*3/uL (ref 4.0–10.5)
WBC: 8.1 10*3/uL (ref 4.0–10.5)

## 2017-06-15 LAB — RETICULOCYTES
RBC.: 4.61 MIL/uL (ref 3.87–5.11)
Retic Count, Absolute: 59.9 10*3/uL (ref 19.0–186.0)
Retic Ct Pct: 1.3 % (ref 0.4–3.1)

## 2017-06-15 LAB — LACTATE DEHYDROGENASE: LDH: 133 U/L (ref 98–192)

## 2017-06-15 LAB — ABO/RH: ABO/RH(D): O POS

## 2017-06-15 MED ORDER — SODIUM CHLORIDE 0.9 % IV SOLN
510.0000 mg | Freq: Once | INTRAVENOUS | Status: AC
  Administered 2017-06-15: 510 mg via INTRAVENOUS
  Filled 2017-06-15: qty 17

## 2017-06-15 MED ORDER — FERROUS SULFATE 325 (65 FE) MG PO TABS
325.0000 mg | ORAL_TABLET | Freq: Every day | ORAL | Status: DC
  Administered 2017-06-16: 325 mg via ORAL
  Filled 2017-06-15: qty 1

## 2017-06-15 MED ORDER — SODIUM CHLORIDE 0.9 % IV SOLN: Freq: Once | INTRAVENOUS | Status: DC

## 2017-06-15 NOTE — Progress Notes (Signed)
CSW received consult regarding patient lack of insurance. Financial counseling will screen patient and assist with a Medicaid application if patient is eligible.  CSW signing off.  Percell Locus Korina Tretter LCSWA [phone]

## 2017-06-15 NOTE — Progress Notes (Signed)
Utilized [employer] to inform pt of need for blood transfusion and to obtain consent.  Pt verbalized understanding and consented to transfusion via interpreter.  Spanish version of consent form was provided to pt and signed.  Interpreter ID number [id number].

## 2017-06-15 NOTE — Progress Notes (Signed)
CRITICAL VALUE ALERT  Critical Value:  Hgb 6.3  Date & Time Notied:  06/15/17 0443  Provider Notified: Aggie Hacker  Orders Received/Actions taken: 1 unit PRBC's

## 2017-06-15 NOTE — Consult Note (Signed)
Reason for Consult: Left Cystic Renal Mass  Referring Physician: Arnoldo Morale MD  Deborah Higgins is an 42 y.o. female.   HPI:   1 - Left Cystic Renal Mass - 4cm left lower pole renal mass with some enhancement and solid / cystic components incidental on CT then dedicated MRI 06/2017. 1 artery / 1 vein left renovascular anatomy. CXR w/o lung lesions.   PMH sig for anemia (Hgb 8s, appear to be Fe defficiency), cesarean x2, obesity. NO ischemic CV disease / blood thinners.  Today "Deborah Higgins" is seen in consultation for left renal mass.   History reviewed. No pertinent past medical history.  Past Surgical History:  Procedure Laterality Date  . CESAREAN SECTION      No family history on file.  Social History:  reports that  has never smoked. she has never used smokeless tobacco. She reports that she does not drink alcohol or use drugs.  Allergies: No Known Allergies  Medications: I have reviewed the patient's current medications.  Results for orders placed or performed during the hospital encounter of 06/14/17 (from the past 48 hour(s))  Lipase, blood     Status: None   Collection Time: 06/14/17  2:16 AM  Result Value Ref Range   Lipase 27 11 - 51 U/L  Comprehensive metabolic panel     Status: Abnormal   Collection Time: 06/14/17  2:16 AM  Result Value Ref Range   Sodium 135 135 - 145 mmol/L   Potassium 3.5 3.5 - 5.1 mmol/L   Chloride 104 101 - 111 mmol/L   CO2 21 (L) 22 - 32 mmol/L   Glucose, Bld 142 (H) 65 - 99 mg/dL   BUN 20 6 - 20 mg/dL   Creatinine, Ser 0.80 0.44 - 1.00 mg/dL   Calcium 9.3 8.9 - 10.3 mg/dL   Total Protein 8.1 6.5 - 8.1 g/dL   Albumin 4.0 3.5 - 5.0 g/dL   AST 18 15 - 41 U/L   ALT 12 (L) 14 - 54 U/L   Alkaline Phosphatase 86 38 - 126 U/L   Total Bilirubin 0.4 0.3 - 1.2 mg/dL   GFR calc non Af Amer >60 >60 mL/min   GFR calc Af Amer >60 >60 mL/min    Comment: (NOTE) The eGFR has been calculated using the CKD EPI equation. This calculation has  not been validated in all clinical situations. eGFR's persistently <60 mL/min signify possible Chronic Kidney Disease.    Anion gap 10 5 - 15  CBC     Status: Abnormal   Collection Time: 06/14/17  2:16 AM  Result Value Ref Range   WBC 16.2 (H) 4.0 - 10.5 K/uL   RBC 5.16 (H) 3.87 - 5.11 MIL/uL   Hemoglobin 7.9 (L) 12.0 - 15.0 g/dL   HCT 30.5 (L) 36.0 - 46.0 %   MCV 59.1 (L) 78.0 - 100.0 fL   MCH 15.3 (L) 26.0 - 34.0 pg   MCHC 25.9 (L) 30.0 - 36.0 g/dL   RDW 19.5 (H) 11.5 - 15.5 %   Platelets 618 (H) 150 - 400 K/uL  Differential     Status: Abnormal   Collection Time: 06/14/17  2:16 AM  Result Value Ref Range   Neutrophils Relative % 87 %   Lymphocytes Relative 10 %   Monocytes Relative 3 %   Eosinophils Relative 0 %   Basophils Relative 0 %   Neutro Abs 14.1 (H) 1.7 - 7.7 K/uL   Lymphs Abs 1.6 0.7 - 4.0 K/uL  Monocytes Absolute 0.5 0.1 - 1.0 K/uL   Eosinophils Absolute 0.0 0.0 - 0.7 K/uL   Basophils Absolute 0.0 0.0 - 0.1 K/uL   RBC Morphology POLYCHROMASIA PRESENT   Urinalysis, Routine w reflex microscopic     Status: Abnormal   Collection Time: 06/14/17  2:17 AM  Result Value Ref Range   Color, Urine YELLOW YELLOW   APPearance HAZY (A) CLEAR   Specific Gravity, Urine 1.026 1.005 - 1.030   pH 5.0 5.0 - 8.0   Glucose, UA NEGATIVE NEGATIVE mg/dL   Hgb urine dipstick SMALL (A) NEGATIVE   Bilirubin Urine NEGATIVE NEGATIVE   Ketones, ur NEGATIVE NEGATIVE mg/dL   Protein, ur 30 (A) NEGATIVE mg/dL   Nitrite NEGATIVE NEGATIVE   Leukocytes, UA MODERATE (A) NEGATIVE   RBC / HPF 0-5 0 - 5 RBC/hpf   WBC, UA 6-30 0 - 5 WBC/hpf   Bacteria, UA RARE (A) NONE SEEN   Squamous Epithelial / LPF 6-30 (A) NONE SEEN   Mucus PRESENT    Hyaline Casts, UA PRESENT   I-Stat beta hCG blood, ED     Status: None   Collection Time: 06/14/17  2:28 AM  Result Value Ref Range   I-stat hCG, quantitative <5.0 <5 mIU/mL   Comment 3            Comment:   GEST. AGE      CONC.  (mIU/mL)   <=1 WEEK         5 - 50     2 WEEKS       50 - 500     3 WEEKS       100 - 10,000     4 WEEKS     1,000 - 30,000        FEMALE AND NON-PREGNANT FEMALE:     LESS THAN 5 mIU/mL   Urinalysis, Routine w reflex microscopic     Status: Abnormal   Collection Time: 06/14/17  2:38 PM  Result Value Ref Range   Color, Urine YELLOW YELLOW   APPearance HAZY (A) CLEAR   Specific Gravity, Urine 1.024 1.005 - 1.030   pH 5.0 5.0 - 8.0   Glucose, UA NEGATIVE NEGATIVE mg/dL   Hgb urine dipstick SMALL (A) NEGATIVE   Bilirubin Urine NEGATIVE NEGATIVE   Ketones, ur NEGATIVE NEGATIVE mg/dL   Protein, ur NEGATIVE NEGATIVE mg/dL   Nitrite NEGATIVE NEGATIVE   Leukocytes, UA TRACE (A) NEGATIVE   RBC / HPF 0-5 0 - 5 RBC/hpf   WBC, UA 0-5 0 - 5 WBC/hpf   Bacteria, UA RARE (A) NONE SEEN   Squamous Epithelial / LPF 0-5 (A) NONE SEEN   Mucus PRESENT   Ferritin     Status: Abnormal   Collection Time: 06/14/17  6:07 PM  Result Value Ref Range   Ferritin 4 (L) 11 - 307 ng/mL  Iron and TIBC     Status: Abnormal   Collection Time: 06/14/17  6:07 PM  Result Value Ref Range   Iron 7 (L) 28 - 170 ug/dL   TIBC 468 (H) 250 - 450 ug/dL   Saturation Ratios 1 (L) 10.4 - 31.8 %   UIBC 461 ug/dL  CBC     Status: Abnormal   Collection Time: 06/15/17  3:48 AM  Result Value Ref Range   WBC 8.3 4.0 - 10.5 K/uL   RBC 4.21 3.87 - 5.11 MIL/uL   Hemoglobin 6.3 (LL) 12.0 - 15.0 g/dL    Comment: REPEATED  TO VERIFY CRITICAL RESULT CALLED TO, READ BACK BY AND VERIFIED WITH: Mellissa Kohut RN [phone] HMILES 06/15/17    HCT 24.7 (L) 36.0 - 46.0 %   MCV 58.7 (L) 78.0 - 100.0 fL   MCH 15.0 (L) 26.0 - 34.0 pg   MCHC 25.5 (L) 30.0 - 36.0 g/dL   RDW 18.9 (H) 11.5 - 15.5 %   Platelets 517 (H) 150 - 400 K/uL  Prepare RBC     Status: None   Collection Time: 06/15/17  4:51 AM  Result Value Ref Range   Order Confirmation ORDER PROCESSED BY BLOOD BANK   Type and screen [place]     Status: None (Preliminary result)   Collection  Time: 06/15/17  4:59 AM  Result Value Ref Range   ABO/RH(D) O POS    Antibody Screen NEG    Sample Expiration 06/18/2017    Unit Number [id number]    Blood Component Type RBC LR PHER1    Unit division 00    Status of Unit ISSUED    Transfusion Status OK TO TRANSFUSE    Crossmatch Result Compatible   ABO/Rh     Status: None   Collection Time: 06/15/17  4:59 AM  Result Value Ref Range   ABO/RH(D) O POS     Mr Abdomen W Or Wo Contrast  Result Date: 06/15/2017 CLINICAL DATA:  Cystic mass lower pole left kidney, for definitive characterization. EXAM: MRI ABDOMEN WITHOUT AND WITH CONTRAST TECHNIQUE: Multiplanar multisequence MR imaging of the abdomen was performed both before and after the administration of intravenous contrast. CONTRAST:  15m MULTIHANCE GADOBENATE DIMEGLUMINE 529 MG/ML IV SOLN COMPARISON:  06/14/2017 FINDINGS: Lower chest: Mild cardiomegaly.  Otherwise unremarkable. Hepatobiliary: 1.6 cm in diameter gallstone is settled dependently in the gallbladder. The common bile duct measures 7 mm in diameter, without appreciable internal filling defect. In the lateral segment left hepatic lobe, a 0.9 by 0.7 cm T2 hyperintense lesion is present on image 17/9, slightly bilobed appearance without appreciable enhancement, probably a small cyst or biliary hamartoma. Pancreas:  Unremarkable Spleen:  Unremarkable Adrenals/Urinary Tract: 4.4 by 3.5 by 4.0 cm enhancing mass with some cystic elements partially exophytic from the left kidney lower pole and abutting the fascia margin of the left psoas muscle. This lesion has mixed internal signal on T2 weighted images, but is primarily high in signal. Enhancing internal nodular elements compatible with a Bosniak category 4 lesion. No internal fat signal intensity identified. Adrenal glands normal. The kidneys and adrenal glands appear otherwise normal. Stomach/Bowel: Unremarkable Vascular/Lymphatic: Unremarkable. No pathologic retroperitoneal adenopathy  identified. Other:  No supplemental non-categorized findings. Musculoskeletal: Unremarkable IMPRESSION: 1. 4.4 cm Bosniak category 4 cystic mass of the left kidney lower pole compatible with renal cell carcinoma. This abuts the adjacent fascia margin of the psoas muscle without definite invasion of the psoas muscle itself. No findings of adenopathy or metastasis. Urologist referral recommended. 2. 1.6 cm gallstone in the gallbladder. 3. Mild extrahepatic biliary dilatation without obvious cause identified. 4. Mild cardiomegaly. 5. 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 Electronically Signed   By: WVan ClinesM.D.   On: 06/15/2017 08:16   [place]  Result Date: 06/14/2017 CLINICAL DATA:  Left renal mass seen on CT EXAM: RENAL / URINARY TRACT ULTRASOUND COMPLETE COMPARISON:  CT 06/14/2017 FINDINGS: Right Kidney: Length: 12.2 cm. Echogenicity within normal limits. No mass or hydronephrosis visualized. Left Kidney: Length: 12.8 cm. Normal echotexture. No hydronephrosis. Complex  cystic mass in the lower pole measures 4.1 x 3.4 x 3.5 cm. Thick internal septations noted. Bladder: Appears normal for degree of bladder distention. IMPRESSION: Complex cystic mass in the lower pole of the left kidney with thick internal septations a possible solid components. Cannot exclude cystic renal neoplasm. This requires further evaluation. Recommend renal MRI with and without contrast. Electronically Signed   By: Rolm Baptise M.D.   On: 06/14/2017 09:47   Ct Renal Stone Study  Result Date: 06/14/2017 CLINICAL DATA:  Acute lower abdominal pain, nausea, vomiting. EXAM: CT ABDOMEN AND PELVIS WITHOUT CONTRAST TECHNIQUE: Multidetector CT imaging of the abdomen and pelvis was performed following the standard protocol without IV contrast. COMPARISON:  None. FINDINGS: Lower chest: No acute abnormality. Hepatobiliary: No focal liver abnormality is seen. No gallstones, gallbladder  wall thickening, or biliary dilatation. Pancreas: Unremarkable. No pancreatic ductal dilatation or surrounding inflammatory changes. Spleen: Normal in size without focal abnormality. Adrenals/Urinary Tract: Adrenal glands appear normal. No hydronephrosis or renal obstruction is noted. No renal or ureteral calculi are noted. Urinary bladder is unremarkable. 2.4 cm exophytic rounded abnormality is seen arising medially from lower pole of left kidney with average Hounsfield measurement of 22. It is uncertain if this represents hyperdense cyst or mass. Right kidney is unremarkable on this unenhanced image. Stomach/Bowel: Stomach is within normal limits. Appendix appears normal. No evidence of bowel wall thickening, distention, or inflammatory changes. Vascular/Lymphatic: No significant vascular findings are present. No enlarged abdominal or pelvic lymph nodes. Reproductive: Probable enlarged fibroid uterus is noted. No adnexal abnormality is noted. Other: No abdominal wall hernia or abnormality. No abdominopelvic ascites. Musculoskeletal: No acute or significant osseous findings. IMPRESSION: No hydronephrosis or renal obstruction is noted. No renal or ureteral calculi are noted. 2.4 cm exophytic rounded abnormality is seen arising medially from lower pole of left kidney. It is uncertain if this represents hyperdense cyst or possible neoplasm. Further evaluation with ultrasound is recommended to rule out malignancy. Probable enlarged fibroid uterus. Electronically Signed   By: Marijo Conception, M.D.   On: 06/14/2017 08:32    Review of Systems  Constitutional: Positive for malaise/fatigue. Negative for chills and fever.  HENT: Negative.   Eyes: Negative.   Respiratory: Negative.   Cardiovascular: Negative.  Negative for chest pain.  Gastrointestinal: Positive for nausea.  Genitourinary: Negative.  Negative for flank pain and hematuria.  Musculoskeletal: Negative.   Skin: Negative.   Neurological: Negative.    Endo/Heme/Allergies: Negative.   Psychiatric/Behavioral: Negative.    Blood pressure (!) 108/45, pulse 69, temperature 98.8 F (37.1 C), temperature source Oral, resp. rate 16, height 5' 6"  (1.676 m), weight 108 kg (238 lb), last menstrual period 05/14/2017, SpO2 100 %. Physical Exam  Constitutional: She appears well-developed.  HENT:  Head: Normocephalic.  Eyes: Pupils are equal, round, and reactive to light.  Neck: Normal range of motion.  Cardiovascular: Normal rate.  Respiratory: Effort normal.  GI: Soft.  Moderate truncal obesity.   Genitourinary:  Genitourinary Comments: NO CVAT.   Musculoskeletal: Normal range of motion.  Neurological: She is alert.  Skin: Skin is warm.  Psychiatric: She has a normal mood and affect.    Assessment/Plan:  1 - Left Cystic Renal Mass - likely stage 1 left renal cancer. This would be UNlikley source of pt's anemia or systemic symptoms at this point, but will require treatment, likely partial nephrecotmy in elective setting. Natural history of disease and management options discussed with pt, husband, daughters today.   I ordered  chest X Ray today to complete staging. We will arrange for GU follow up to discuss surgery timing.   We will follow the patient PRN at this point during this hospitalization, please call me directly with questions anytime.   Stephanos Fan 06/15/2017, 1:04 PM

## 2017-06-15 NOTE — Progress Notes (Signed)
Subjective: Pt is feeling better this morning compared to presentation. She has an appetite and has not vomited or felt nauseous over the last day. She has been having loose bowel movements that do not appear dark or bloody.  She reported dysuria overnight that was treated with fosfomycin after a urine culture was obtained. Her morning Hgb also returned back at 6.3, and she was transfused 1 unit of blood this morning without complication.   History was obtained with the assistance of a Spanish interpreter.  Objective: Vital signs in last 24 hours: Vitals:   06/15/17 0550 06/15/17 0610 06/15/17 0645 06/15/17 0900  BP: (!) 109/51 (!) 102/43 (!) 104/51 (!) 108/45  Pulse: 65 65 65 69  Resp: 16 14 16 16   Temp: 99 F (37.2 C) 98.5 F (36.9 C) 98.5 F (36.9 C) 98.8 F (37.1 C)  TempSrc: Oral Oral Oral Oral  SpO2: 100% 98% 98% 100%  Weight:      Height:        Intake/Output Summary (Last 24 hours) at 06/15/2017 1302 Last data filed at 06/15/2017 0940 Gross per 24 hour  Intake 1015 ml  Output -  Net 1015 ml    General Appearance:    Alert, cooperative, no distress, appears stated age  Eyes:    EOMI, mild conjunctival pallor   Throat:   Moist oral mucosa  Neck:   Mobile, nontender 2cm mass on L lateral aspect with no overlying skin changes  Lungs:     Clear to auscultation bilaterally, respirations unlabored  Chest Wall:    No tenderness or deformity   Heart:    Regular rate and rhythm, S1 and S2 normal and distant, no murmur, rub, or gallop  Abdomen:     Soft, non-tender, no masses  Skin:   Skin color appropriate for ethnicity with no appreciable pallor   Lab Results: WBC - 8.3, Hgb - 6.3 (L), Hct - 24.7 (L), MCV - 58.7 (L), Platelets - 517 (H)  Ferritin - 4 (L), Iron - 7 (L), TIBC - 4168 (H), Retic Ct % - 1.3%  Urinalysis (1/3) Hgb - Small, Nitrites - Negative, WBCs - Trace (<5), Bacteria - Rare   Micro Results: Urine culture (1/4) pending.   Studies/Results:  MRI  Abdomen W&Wo Contrast Study Date: 06/14/17  FINDINGS:  Lower chest: Mild cardiomegaly.  Otherwise unremarkable.  Hepatobiliary: 1.6 cm in diameter gallstone is settled dependently in the gallbladder. The common bile duct measures 7 mm in diameter, without appreciable internal filling defect. In the lateral segment left hepatic lobe, a 0.9 by 0.7 cm T2 hyperintense lesion is present on image 17/9, slightly bilobed appearance without appreciable enhancement, probably a small cyst or biliary hamartoma.  Pancreas, Spleen:  Unremarkable  Adrenals/Urinary Tract: 4.4 by 3.5 by 4.0 cm enhancing mass with some cystic elements partially exophytic from the left kidney lower pole and abutting the fascia margin of the left psoas muscle. This lesion has mixed internal signal on T2 weighted images, but is primarily high in signal. Enhancing internal nodular elements compatible with a Bosniak category 4 lesion. No internal fat signal intensity identified. Adrenal glands normal. The kidneys and adrenal glands appear otherwise normal. Stomach/Bowel: Unremarkable Vascular/Lymphatic: Unremarkable. No pathologic retroperitoneal adenopathy identified. Other:  No supplemental non-categorized findings. Musculoskeletal: Unremarkable   IMPRESSION:  1. 4.4 cm Bosniak category 4 cystic mass of the left kidney lower pole compatible with renal cell carcinoma. This abuts the adjacent fascia margin of the psoas muscle without definite invasion of  the psoas muscle itself. No findings of adenopathy or metastasis. Urologist referral recommended.  2. 1.6 cm gallstone in the gallbladder.  3. Mild extrahepatic biliary dilatation without obvious cause identified.  4. Mild cardiomegaly.  5. 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   Medications: I have reviewed the patient's current medications. Scheduled Meds: . enoxaparin (LOVENOX) injection  40 mg Subcutaneous Q24H  . sodium chloride  flush  3 mL Intravenous Q12H   PRN Meds:.sodium chloride, ondansetron **OR** ondansetron (ZOFRAN) IV, sodium chloride flush   Assessment/Plan: Pt is a 42yo otherwise healthy female who presented with N/V/D and has been found to have anemia, iron deficiency, and incidentally, a complex cyst on her L kidney. Pt will continue inpatient hospitalization as we consult urology for management of her renal mass and replenish her Hgb count.   Mass of left kidney - complex cystic mass (3x3x4 cm) visualized on MRI - Categorization as Bozniak level 4 is concerning for malignancy  - pt reports possible history of unintentional wt loss (~15 lbs) before June - Urology has been consulted this afternoon. Would appreciate recs for outpatient follow-up     Microcytic anemia - history of menorrhagia (7 days of using 6 pads/day). Colon cancer less likely without any chronic GI symptoms. No free fluid accumulation has been seen in the abdomen during scans  - Hgb has dropped to 6.3 from 7.9 overnight, and the pt has been transfused 1 unit. - Iron studies reflect iron deficiency with a low ferritin, low serum Fe, and high TIBC - Retic count is inappropriately low at 1.3% - Haptoglobin and LDH have been ordered to r/o hemolysis. T. Bili on 1/3 was wnl.  - Plan to rpt CBC in the morning     Leukocytosis - Likely secondary to gastroenteritis. Resolved.     Thrombocytosis (HCC) - Reactionary to gastroenteritis vs secondary to Fe deficiency anemia - Has improved over the last 24 hrs but not resolved - Plan to repeat CBC in the morning     Nausea vomiting and diarrhea - Acute onset - Likely gastroenteritis. With imaging and self-limited course of symptoms, no longer concerned for nephrolithiasis, pyelonephritis, small bowel obstruction, or ACS, especially with concurrent diarrhea - N/V has resolved, but patient is continuing to have loose BM's  Dysuria - Rpt U/A was negative  - Pt has been treated with 1  dose of Fosfomycin for persistent symptoms - Urine culture pending.    This is a Careers information officer Note.  The care of the patient was discussed with Dr. Charlynn Grimes and Dr. Rebeca Alert and the assessment and plan formulated with their assistance.  Please see their attached note for official documentation of the daily encounter.   LOS: 1 day   Annye Asa, Medical Student 06/15/2017, 1:02 PM

## 2017-06-15 NOTE — Progress Notes (Signed)
  Date: 06/15/2017  Patient name: Deborah Higgins  Medical record number: [id number]  Date of birth: 1975/12/08   I have seen and evaluated this patient and I have discussed the plan of care with the house staff. Please see their note for complete details. I concur with their findings with the following additions/corrections:   42 year old female who presented with vomiting and diarrhea who was found incidentally to have a renal mass and iron deficiency anemia. Fortunately, her vomiting has resolved, but she does have some lingering diarrhea today. She denies any blood in her stool. She does not have any abdominal pain and has a benign abdominal exam today.  Regarding her renal mass, MRI shows a Bosniak class IV mass, which is highly concerning for renal cell carcinoma. We have consulted urology, who said they will come see her today and recommended outpatient follow-up. This morning on rounds, we communicated to her that at that point we were trying to figure out whether it was a malignancy or not and that we would keep her up-to-date on that.  Her anemia appears to be due to severe iron deficiency. Per her history, she has heavy periods, which may have caused the iron deficiency. Renal cell carcinoma is associated with anemia at diagnosis, but more typically anemia of chronic disease. She was transfused 1 unit of PRBCs today with appropriate response to 8.0, and we will recheck her H&H tomorrow to ensure stability. LDH and bilirubin are normal, suggesting against hemolysis, and she has no obvious sources of blood loss.  Oda Kilts, MD 06/15/2017, 4:20 PM

## 2017-06-16 DIAGNOSIS — D509 Iron deficiency anemia, unspecified: Secondary | ICD-10-CM

## 2017-06-16 DIAGNOSIS — N2889 Other specified disorders of kidney and ureter: Secondary | ICD-10-CM

## 2017-06-16 DIAGNOSIS — Z9889 Other specified postprocedural states: Secondary | ICD-10-CM

## 2017-06-16 DIAGNOSIS — A084 Viral intestinal infection, unspecified: Secondary | ICD-10-CM

## 2017-06-16 LAB — CBC
HCT: 27.5 % — ABNORMAL LOW (ref 36.0–46.0)
Hemoglobin: 7.4 g/dL — ABNORMAL LOW (ref 12.0–15.0)
MCH: 16.4 pg — ABNORMAL LOW (ref 26.0–34.0)
MCHC: 26.9 g/dL — ABNORMAL LOW (ref 30.0–36.0)
MCV: 60.8 fL — ABNORMAL LOW (ref 78.0–100.0)
Platelets: 451 10*3/uL — ABNORMAL HIGH (ref 150–400)
RBC: 4.52 MIL/uL (ref 3.87–5.11)
RDW: 21.3 % — ABNORMAL HIGH (ref 11.5–15.5)
WBC: 9 10*3/uL (ref 4.0–10.5)

## 2017-06-16 LAB — TYPE AND SCREEN
ABO/RH(D): O POS
Antibody Screen: NEGATIVE
Unit division: 0

## 2017-06-16 LAB — URINE CULTURE: Culture: NO GROWTH

## 2017-06-16 LAB — BPAM RBC
Blood Product Expiration Date: 201902012359
ISSUE DATE / TIME: 201901040612
Unit Type and Rh: 5100

## 2017-06-16 LAB — HAPTOGLOBIN: Haptoglobin: 147 mg/dL (ref 34–200)

## 2017-06-16 MED ORDER — FERROUS SULFATE 325 (65 FE) MG PO TABS: 325.0000 mg | ORAL_TABLET | Freq: Every day | ORAL | 0 refills | Status: DC

## 2017-06-16 MED ORDER — ACETAMINOPHEN 325 MG PO TABS
650.0000 mg | ORAL_TABLET | Freq: Once | ORAL | Status: AC | PRN
  Administered 2017-06-16: 650 mg via ORAL
  Filled 2017-06-16: qty 2

## 2017-06-16 NOTE — Plan of Care (Signed)
  Education: Knowledge of General Education information will improve 06/16/2017 0335 - Progressing by Anson Fret, RN Note POC reviewed with pt.

## 2017-06-16 NOTE — Progress Notes (Signed)
Deborah Higgins to be D/C'd Home per MD order.  Discussed with the patient and all questions fully answered.  VSS, Skin clean, dry and intact without evidence of skin break down, no evidence of skin tears noted. IV catheter discontinued intact. Site without signs and symptoms of complications. Dressing and pressure applied.  An After Visit Summary was printed and given to the patient. Patient received prescription.  D/c education completed with patient/family including follow up instructions, medication list, d/c activities limitations if indicated, with other d/c instructions as indicated by MD - patient able to verbalize understanding, all questions fully answered.   Patient instructed to return to ED, call 911, or call MD for any changes in condition.   Patient escorted via [place], and D/C home via private auto.  Holley Raring 06/16/2017 6:37 PM

## 2017-06-16 NOTE — Progress Notes (Signed)
MD notified of patient having lower back pain rating of 5 and requesting pain medicine. Also patient wanted to know if she could shower. MD said try heat pack and if that does not work try tylenol. And also approved her to shower.

## 2017-06-16 NOTE — Progress Notes (Signed)
Two heat packs were applied to the patient's lower back for her pain per MD order.

## 2017-06-16 NOTE — Progress Notes (Signed)
Subjective: Patient feels okay today. No more n/v and hasn't had diarrhea since yesterday. She denied any blood or black stools. She complains low back pain. She and her husband wonder about the next steps and if she can continue to rest at home instead of in the hospital.   Objective: Vital signs in last 24 hours: Vitals:   06/15/17 0900 06/15/17 1511 06/15/17 2103 06/16/17 0511  BP: (!) 108/45 (!) 121/46 127/64 (!) 116/41  Pulse: 69 74 76 69  Resp: 16 20 20 20   Temp: 98.8 F (37.1 C) 98.2 F (36.8 C) 98.9 F (37.2 C) 99 F (37.2 C)  TempSrc: Oral Oral Oral Oral  SpO2: 100% 100% 100% 100%  Weight:      Height:        Intake/Output Summary (Last 24 hours) at 06/16/2017 1207 Last data filed at 06/15/2017 1400 Gross per 24 hour  Intake 160 ml  Output -  Net 160 ml    General Appearance:    Alert, cooperative, no distress, appears stated age     Throat:   Moist oral mucosa     Lungs:     Clear to auscultation bilaterally, respirations unlabored  Chest Wall:    No tenderness or deformity   Heart:    Regular rate and rhythm, S1 and S2 normal and distant, no murmur, rub, or gallop  Abdomen:     Soft, non-tender, no masses  Skin:   Skin color appropriate for ethnicity with no appreciable pallor   CBC Latest Ref Rng & Units 06/16/2017 06/15/2017 06/15/2017  WBC 4.0 - 10.5 K/uL 9.0 8.1 8.3  Hemoglobin 12.0 - 15.0 g/dL 7.4(L) 8.0(L) 6.3(LL)  Hematocrit 36.0 - 46.0 % 27.5(L) 28.5(L) 24.7(L)  Platelets 150 - 400 K/uL 451(H) 520(H) 517(H)   MRI Abdomen W&Wo Contrast Study Date: 06/14/17 IMPRESSION:  1. 4.4 cm Bosniak category 4 cystic mass of the left kidney lower pole compatible with renal cell carcinoma. This abuts the adjacent fascia margin of the psoas muscle without definite invasion of the psoas muscle itself. No findings of adenopathy or metastasis. Urologist referral recommended.  2. 1.6 cm gallstone in the gallbladder.  3. Mild extrahepatic biliary dilatation without obvious cause  identified.  4. Mild cardiomegaly.  5. 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   Medications: I have reviewed the patient's current medications. Scheduled Meds: . enoxaparin (LOVENOX) injection  40 mg Subcutaneous Q24H  . ferrous sulfate  325 mg Oral Q breakfast  . sodium chloride flush  3 mL Intravenous Q12H   PRN Meds:.sodium chloride, acetaminophen, ondansetron **OR** ondansetron (ZOFRAN) IV, sodium chloride flush   Assessment/Plan: Pt is a 41yo otherwise healthy female who presented with N/V/D and has been found to have anemia, iron deficiency, and incidentally, a complex cyst on her L kidney which is concerning for Renal Cell Carcinoma. She has been seen by Urology who recommend outpatient follow-up and resection.   Mass of left kidney, concerning for Renal Cell Carcinoma Seen by urology who recommend outpatient treatment. She denies any dysuria today but does have some low back pain. She will be discharged today with close outpatient urology follow-up.   Microcytic anemia Hemoglobin 6.3 on admission which improved to 7.9 following transfusion of 1 unit of PRB. Hb 7.4 this morning. She is profoundly iron deficient and received IV iron yesterday. She does have menorrhagia which could be the source. Haptoglobin and LDH wnl suggesting against hemolysis.  - Iron studies reflect  iron deficiency with a low ferritin, low serum Fe, and high TIBC - Retic count is inappropriately low at 1.3% - Haptoglobin and LDH have been ordered to r/o hemolysis. T. Bili on 1/3 was wnl.  - Will need repeat blood values at her follow-up appointment  Leukocytosis, Gastroenteriris -Likely secondary to gastroenteritis. Resolved.    Thrombocytosis (HCC) - Reactionary to gastroenteritis vs secondary to Fe deficiency anemia. Resolved.   Dysuria Resolved.   Anticipated discharge TODAY.    LOS: 2 days   Ruqayyah Lute, DO 06/16/2017, 12:07 PM

## 2017-06-16 NOTE — Progress Notes (Signed)
The heat packs gave temporary relief but patient was still in pain so Tylenol was given. Patient was educated on the purpose of trying the heat pack first before tylenol. Patient gave verbal understanding. I will continue to monitor her pain.

## 2017-06-17 NOTE — Discharge Summary (Signed)
Name: Deborah Higgins MRN: [id number] DOB: [birth year] 42 y.o. PCP: Arnoldo Morale, MD  Date of Admission: 06/14/2017  4:00 AM Date of Discharge: 06/16/2017 Attending Physician: Lucious Groves., DO  Discharge Diagnosis: 1. Mass of left concerning for renal cell carcinoma 2. Viral gastroenteritis 3. Severe iron deficiency anemia  Principal Problem:   Mass of left kidney Active Problems:   Iron deficiency anemia   Leukocytosis   Thrombocytosis (HCC)   Nausea vomiting and diarrhea  Discharge Medications: Allergies as of 06/16/2017   No Known Allergies     Medication List    TAKE these medications   ferrous sulfate 325 (65 FE) MG tablet Take 1 tablet (325 mg total) by mouth daily with breakfast.       Disposition and follow-up:   [name] was discharged from [facility] in Stable condition.  At the hospital follow up visit please address:  1. Renal Cell Carcinoma: Ensure follow-up is scheduled with urology.  N/V/D: Ensure resolution. Likely viral gastroenteritis Iron deficiency anemia: Ensure compliance with supplementation  2.  Labs / imaging needed at time of follow-up: CBC  3.  Pending labs/ test needing follow-up: none  Follow-up Appointments: Follow-up Information    Arnoldo Morale, MD. Schedule an appointment as soon as possible for a visit in 1 week(s).   Specialty:  Family Medicine Contact information: [place] [address] [phone]        Alexis Frock, MD. Schedule an appointment as soon as possible for a visit in 1 week(s).   Specialty:  Urology Why:  PLEASE CALL [facility] TO MAKE AN APPOINTMENT WITHIN THE NEXT WEEK. Contact information: [place] [address] [phone]           Hospital Course by problem list: Principal Problem:   Mass of left kidney Active Problems:   Iron deficiency anemia   Leukocytosis   Thrombocytosis (HCC)   Nausea vomiting  and diarrhea   1. Mass of left kidney 2. Viral Gastroenteritis Very pleasant Spanish-speaking 42 year old female presented for evaluation of nausea, vomiting and diarrhea. CT renal stone study showed left renal mass which was concerning for renal cell carcinoma. Follow-up MRI showed a 4.4 cm Bosniak category 4 cystic mass of the left lower pole compatible with renal cell carcinoma. She was evaluated by urology who are concerned about the potential for malignancy and will follow up with her outpatient. Her nausea, vomiting and diarrhea resolved spontaneously and is thought to be due to a viral gastroenteritis.  3. Iron deficiency anemia On admission the patient had a normocytic anemia with hemoglobin of 7.9 and MCV of 59. She was treated with IV fluids and CBC the following morning revealed a hemoglobin of 6.3. She was subsequently transfused one unit of packed red blood cells with appropriate hemoglobin response. Workup of anemia showed severe iron deficiency with an iron of 7, ferritin of 4, TIBC 468 and she was subsequently given IV iron replacement and started on by mouth supplementation. Haptoglobin 147 suggested against hemolytic process. The patient does describe menorrhagia.  Discharge Vitals:   BP 132/61 (BP Location: Left Arm)   Pulse 69   Temp 98.7 F (37.1 C) (Oral)   Resp 20   Ht 5\' 6"  (1.676 m)   Wt 238 lb (108 kg)   LMP 05/14/2017 Comment: pt does not speak english, and is unable to exrpess the date of her last menstral cycle   SpO2 100%   BMI 38.41  kg/m   Pertinent Labs, Studies, and Procedures:  MRI: 4.4 cm Bosniak category 4 cystic mass of left kidney concerning for renal cell carcinoma Iron 7, ferritin 4, TIBC 468 Transfused 1 unit PRBC Given IV iron  Discharge Instructions: [name] you were admitted for evaluation of nausea, vomiting and diarrhea. Unfortunately imaging revealed a mass in her left kidney which is currently concerning for renal cell carcinoma.  During your hospitalization you were seen by urology who recommended outpatient follow-up and treatment. Also found to have profound iron deficiency and were given IV iron and started on by mouth supplements as well. Please follow-up with your primary care physician and urology.  SignedEinar Gip, DO 06/17/2017, 1:12 PM   Pager: [phone]

## 2017-07-24 ENCOUNTER — Other Ambulatory Visit: Payer: Self-pay | Admitting: Urology

## 2017-08-03 ENCOUNTER — Ambulatory Visit: Payer: Self-pay | Attending: Family Medicine

## 2017-08-20 ENCOUNTER — Other Ambulatory Visit (HOSPITAL_COMMUNITY): Payer: Self-pay

## 2017-08-20 NOTE — Patient Instructions (Signed)
Deborah Higgins  08/20/2017   Your procedure is scheduled on: Wednesday 08/29/2017  Report to [facility] Main  Entrance              Report to admitting at  [place]  AM    Call this number if you have problems the morning of surgery [phone]              Follow bowel prep instructions from Dr. Zettie Pho office the day before surgery on 08/28/2017 with a clear liquid diet up  til midnight!             Use Magnesium citrate-one bottle -drink it by noon the day before surgery                CLEAR LIQUID DIET   Foods Allowed                                                                     Foods Excluded  Coffee and tea, regular and decaf                             liquids that you cannot  Plain Jell-O in any flavor                                             see through such as: Fruit ices (not with fruit pulp)                                     milk, soups, orange juice  Iced Popsicles                                    All solid food Carbonated beverages, regular and diet                                    Cranberry, grape and apple juices Sports drinks like Gatorade Lightly seasoned clear broth or consume(fat free) Sugar, honey syrup  Sample Menu Breakfast                                Lunch                                     Supper Cranberry juice                    Beef broth                            Chicken broth Jell-O  Grape juice                           Apple juice Coffee or tea                        Jell-O                                      Popsicle                                                Coffee or tea                        Coffee or tea  _____________________________________________________________________    Remember: Do not eat food or drink liquids :After Midnight.      Take these medicines the morning of surgery with A SIP OF WATER: none                                You  may not have any metal on your body including hair pins and              piercings  Do not wear jewelry, make-up, lotions, powders or perfumes, deodorant             Do not wear nail polish.  Do not shave  48 hours prior to surgery.              Men may shave face and neck.   Do not bring valuables to the hospital. [place].  Contacts, dentures or bridgework may not be worn into surgery.  Leave suitcase in the car. After surgery it may be brought to your room.                  Please read over the following fact sheets you were given: _____________________________________________________________________             [facility] - Preparing for Surgery Before surgery, you can play an important role.  Because skin is not sterile, your skin needs to be as free of germs as possible.  You can reduce the number of germs on your skin by washing with CHG (chlorahexidine gluconate) soap before surgery.  CHG is an antiseptic cleaner which kills germs and bonds with the skin to continue killing germs even after washing. Please DO NOT use if you have an allergy to CHG or antibacterial soaps.  If your skin becomes reddened/irritated stop using the CHG and inform your nurse when you arrive at Short Stay. Do not shave (including legs and underarms) for at least 48 hours prior to the first CHG shower.  You may shave your face/neck. Please follow these instructions carefully:  1.  Shower with CHG Soap the night before surgery and the  morning of Surgery.  2.  If you choose to wash your hair, wash your hair first as usual with your  normal  shampoo.  3.  After you shampoo, rinse your hair and  body thoroughly to remove the  shampoo.                           4.  Use CHG as you would any other liquid soap.  You can apply chg directly  to the skin and wash                       Gently with a scrungie or clean washcloth.  5.  Apply the CHG Soap to your body ONLY  FROM THE NECK DOWN.   Do not use on face/ open                           Wound or open sores. Avoid contact with eyes, ears mouth and genitals (private parts).                       Wash face,  Genitals (private parts) with your normal soap.             6.  Wash thoroughly, paying special attention to the area where your surgery  will be performed.  7.  Thoroughly rinse your body with warm water from the neck down.  8.  DO NOT shower/wash with your normal soap after using and rinsing off  the CHG Soap.                9.  Pat yourself dry with a clean towel.            10.  Wear clean pajamas.            11.  Place clean sheets on your bed the night of your first shower and do not  sleep with pets. Day of Surgery : Do not apply any lotions/deodorants the morning of surgery.  Please wear clean clothes to the hospital/surgery center.  FAILURE TO FOLLOW THESE INSTRUCTIONS MAY RESULT IN THE CANCELLATION OF YOUR SURGERY PATIENT SIGNATURE_________________________________  NURSE SIGNATURE__________________________________  ________________________________________________________________________  WHAT IS A BLOOD TRANSFUSION? Blood Transfusion Information  A transfusion is the replacement of blood or some of its parts. Blood is made up of multiple cells which provide different functions.  Red blood cells carry oxygen and are used for blood loss replacement.  White blood cells fight against infection.  Platelets control bleeding.  Plasma helps clot blood.  Other blood products are available for specialized needs, such as hemophilia or other clotting disorders. BEFORE THE TRANSFUSION  Who gives blood for transfusions?   Healthy volunteers who are fully evaluated to make sure their blood is safe. This is blood bank blood. Transfusion therapy is the safest it has ever been in the practice of medicine. Before blood is taken from a donor, a complete history is taken to make sure that person has  no history of diseases nor engages in risky social behavior (examples are intravenous drug use or sexual activity with multiple partners). The donor's travel history is screened to minimize risk of transmitting infections, such as malaria. The donated blood is tested for signs of infectious diseases, such as HIV and hepatitis. The blood is then tested to be sure it is compatible with you in order to minimize the chance of a transfusion reaction. If you or a relative donates blood, this is often done in anticipation of surgery and is not appropriate for emergency situations. It takes many days to process  the donated blood. RISKS AND COMPLICATIONS Although transfusion therapy is very safe and saves many lives, the main dangers of transfusion include:   Getting an infectious disease.  Developing a transfusion reaction. This is an allergic reaction to something in the blood you were given. Every precaution is taken to prevent this. The decision to have a blood transfusion has been considered carefully by your caregiver before blood is given. Blood is not given unless the benefits outweigh the risks. AFTER THE TRANSFUSION  Right after receiving a blood transfusion, you will usually feel much better and more energetic. This is especially true if your red blood cells have gotten low (anemic). The transfusion raises the level of the red blood cells which carry oxygen, and this usually causes an energy increase.  The nurse administering the transfusion will monitor you carefully for complications. HOME CARE INSTRUCTIONS  No special instructions are needed after a transfusion. You may find your energy is better. Speak with your caregiver about any limitations on activity for underlying diseases you may have. SEEK MEDICAL CARE IF:   Your condition is not improving after your transfusion.  You develop redness or irritation at the intravenous (IV) site. SEEK IMMEDIATE MEDICAL CARE IF:  Any of the following  symptoms occur over the next 12 hours:  Shaking chills.  You have a temperature by mouth above 102 F (38.9 C), not controlled by medicine.  Chest, back, or muscle pain.  People around you feel you are not acting correctly or are confused.  Shortness of breath or difficulty breathing.  Dizziness and fainting.  You get a rash or develop hives.  You have a decrease in urine output.  Your urine turns a dark color or changes to pink, red, or brown. Any of the following symptoms occur over the next 10 days:  You have a temperature by mouth above 102 F (38.9 C), not controlled by medicine.  Shortness of breath.  Weakness after normal activity.  The white part of the eye turns yellow (jaundice).  You have a decrease in the amount of urine or are urinating less often.  Your urine turns a dark color or changes to pink, red, or brown. Document Released: 05/26/2000 Document Revised: 08/21/2011 Document Reviewed: 01/13/2008 [facility] Patient Information 2014 [place], [place].  _______________________________________________________________________

## 2017-08-20 NOTE — Progress Notes (Signed)
06/14/2017- noted in Epic-EKG  06/15/2017- noted in Epic-CXR

## 2017-08-21 ENCOUNTER — Encounter (HOSPITAL_COMMUNITY)
Admission: RE | Admit: 2017-08-21 | Discharge: 2017-08-21 | Disposition: A | Discharge status: Home / Self Care | Payer: Self-pay | Source: Ambulatory Visit | Attending: Urology | Admitting: Urology

## 2017-08-21 ENCOUNTER — Other Ambulatory Visit: Payer: Self-pay

## 2017-08-21 ENCOUNTER — Encounter (HOSPITAL_COMMUNITY): Payer: Self-pay

## 2017-08-21 DIAGNOSIS — Z01812 Encounter for preprocedural laboratory examination: Secondary | ICD-10-CM | POA: Insufficient documentation

## 2017-08-21 HISTORY — DX: Adverse effect of unspecified anesthetic, initial encounter: T41.45XA

## 2017-08-21 HISTORY — DX: Other complications of anesthesia, initial encounter: T88.59XA

## 2017-08-21 LAB — CBC
HEMATOCRIT: 34.1 % — AB (ref 36.0–46.0)
Hemoglobin: 10.6 g/dL — ABNORMAL LOW (ref 12.0–15.0)
MCH: 24.2 pg — AB (ref 26.0–34.0)
MCHC: 31.1 g/dL (ref 30.0–36.0)
MCV: 77.9 fL — ABNORMAL LOW (ref 78.0–100.0)
Platelets: 468 10*3/uL — ABNORMAL HIGH (ref 150–400)
RBC: 4.38 MIL/uL (ref 3.87–5.11)
RDW: 20.7 % — AB (ref 11.5–15.5)
WBC: 9.2 10*3/uL (ref 4.0–10.5)

## 2017-08-21 LAB — HCG, SERUM, QUALITATIVE: Preg, Serum: NEGATIVE

## 2017-08-21 LAB — BASIC METABOLIC PANEL
Anion gap: 7 (ref 5–15)
BUN: 15 mg/dL (ref 6–20)
CO2: 25 mmol/L (ref 22–32)
CREATININE: 0.59 mg/dL (ref 0.44–1.00)
Calcium: 9.1 mg/dL (ref 8.9–10.3)
Chloride: 104 mmol/L (ref 101–111)
GFR calc non Af Amer: >60 mL/min (ref >60)
GLUCOSE: 90 mg/dL (ref 65–99)
Potassium: 4.2 mmol/L (ref 3.5–5.1)
Sodium: 136 mmol/L (ref 135–145)

## 2017-08-29 ENCOUNTER — Encounter (HOSPITAL_COMMUNITY): Payer: Self-pay | Admitting: Emergency Medicine

## 2017-08-29 ENCOUNTER — Encounter (HOSPITAL_COMMUNITY)
Admission: RE | Disposition: A | Discharge status: Home / Self Care | Payer: Self-pay | Source: Ambulatory Visit | Attending: Urology

## 2017-08-29 ENCOUNTER — Inpatient Hospital Stay (HOSPITAL_COMMUNITY): Payer: Self-pay

## 2017-08-29 ENCOUNTER — Other Ambulatory Visit: Payer: Self-pay

## 2017-08-29 ENCOUNTER — Inpatient Hospital Stay (HOSPITAL_COMMUNITY)
Admission: RE | Admit: 2017-08-29 | Discharge: 2017-08-31 | DRG: 661 | Disposition: A | Discharge status: Home / Self Care | Payer: Self-pay | Source: Ambulatory Visit | Attending: Urology | Admitting: Urology

## 2017-08-29 DIAGNOSIS — E669 Obesity, unspecified: Secondary | ICD-10-CM | POA: Diagnosis present

## 2017-08-29 DIAGNOSIS — Z6838 Body mass index (BMI) 38.0-38.9, adult: Secondary | ICD-10-CM

## 2017-08-29 DIAGNOSIS — D649 Anemia, unspecified: Secondary | ICD-10-CM | POA: Diagnosis present

## 2017-08-29 DIAGNOSIS — Z79899 Other long term (current) drug therapy: Secondary | ICD-10-CM

## 2017-08-29 DIAGNOSIS — N2889 Other specified disorders of kidney and ureter: Principal | ICD-10-CM | POA: Diagnosis present

## 2017-08-29 DIAGNOSIS — K529 Noninfective gastroenteritis and colitis, unspecified: Secondary | ICD-10-CM | POA: Diagnosis present

## 2017-08-29 HISTORY — PX: ROBOT ASSISTED LAPAROSCOPIC NEPHRECTOMY: SHX5140

## 2017-08-29 LAB — TYPE AND SCREEN
ABO/RH(D): O POS
Antibody Screen: NEGATIVE

## 2017-08-29 LAB — ABO/RH: ABO/RH(D): O POS

## 2017-08-29 SURGERY — NEPHRECTOMY, RADICAL, ROBOT-ASSISTED, LAPAROSCOPIC, ADULT
Anesthesia: General | Laterality: Left

## 2017-08-29 MED ORDER — LACTATED RINGERS IV SOLN
INTRAVENOUS | Status: DC
  Administered 2017-08-29 (×2): via INTRAVENOUS

## 2017-08-29 MED ORDER — SUFENTANIL CITRATE 50 MCG/ML IV SOLN
INTRAVENOUS | Status: AC
  Filled 2017-08-29: qty 1

## 2017-08-29 MED ORDER — HYDROMORPHONE HCL 1 MG/ML IJ SOLN
INTRAMUSCULAR | Status: AC
  Filled 2017-08-29: qty 1

## 2017-08-29 MED ORDER — BUPIVACAINE LIPOSOME 1.3 % IJ SUSP
20.0000 mL | Freq: Once | INTRAMUSCULAR | Status: AC
  Administered 2017-08-29: 20 mL
  Filled 2017-08-29: qty 20

## 2017-08-29 MED ORDER — ONDANSETRON HCL 4 MG/2ML IJ SOLN
INTRAMUSCULAR | Status: DC | PRN
  Administered 2017-08-29: 4 mg via INTRAVENOUS

## 2017-08-29 MED ORDER — SODIUM CHLORIDE 0.9 % IJ SOLN
INTRAMUSCULAR | Status: AC
  Filled 2017-08-29: qty 20

## 2017-08-29 MED ORDER — DEXAMETHASONE SODIUM PHOSPHATE 10 MG/ML IJ SOLN
INTRAMUSCULAR | Status: DC | PRN
  Administered 2017-08-29: 10 mg via INTRAVENOUS

## 2017-08-29 MED ORDER — ROCURONIUM BROMIDE 10 MG/ML (PF) SYRINGE
PREFILLED_SYRINGE | INTRAVENOUS | Status: AC
  Filled 2017-08-29: qty 5

## 2017-08-29 MED ORDER — LIDOCAINE 2% (20 MG/ML) 5 ML SYRINGE
INTRAMUSCULAR | Status: DC | PRN
  Administered 2017-08-29: 100 mg via INTRAVENOUS

## 2017-08-29 MED ORDER — LIDOCAINE 2% (20 MG/ML) 5 ML SYRINGE
INTRAMUSCULAR | Status: AC
  Filled 2017-08-29: qty 5

## 2017-08-29 MED ORDER — DEXTROSE-NACL 5-0.45 % IV SOLN
INTRAVENOUS | Status: DC
  Administered 2017-08-29: 16:00:00 via INTRAVENOUS

## 2017-08-29 MED ORDER — ONDANSETRON HCL 4 MG/2ML IJ SOLN
INTRAMUSCULAR | Status: AC
  Filled 2017-08-29: qty 2

## 2017-08-29 MED ORDER — DIPHENHYDRAMINE HCL 12.5 MG/5ML PO ELIX: 12.5000 mg | ORAL_SOLUTION | Freq: Four times a day (QID) | ORAL | Status: DC | PRN

## 2017-08-29 MED ORDER — BELLADONNA ALKALOIDS-OPIUM 16.2-60 MG RE SUPP: 1.0000 | Freq: Four times a day (QID) | RECTAL | Status: DC | PRN

## 2017-08-29 MED ORDER — DIPHENHYDRAMINE HCL 50 MG/ML IJ SOLN
12.5000 mg | Freq: Once | INTRAMUSCULAR | Status: AC
  Administered 2017-08-29: 12.5 mg via INTRAVENOUS

## 2017-08-29 MED ORDER — LACTATED RINGERS IR SOLN
Status: DC | PRN
  Administered 2017-08-29: 1000 mL

## 2017-08-29 MED ORDER — HYDROCODONE-ACETAMINOPHEN 5-325 MG PO TABS: 1.0000 | ORAL_TABLET | Freq: Four times a day (QID) | ORAL | 0 refills | Status: DC | PRN

## 2017-08-29 MED ORDER — CEFAZOLIN SODIUM-DEXTROSE 2-4 GM/100ML-% IV SOLN
2.0000 g | INTRAVENOUS | Status: AC
  Administered 2017-08-29: 2 g via INTRAVENOUS
  Filled 2017-08-29: qty 100

## 2017-08-29 MED ORDER — HYDROMORPHONE HCL 1 MG/ML IJ SOLN
0.5000 mg | INTRAMUSCULAR | Status: DC | PRN
  Administered 2017-08-29 (×2): 0.5 mg via INTRAVENOUS
  Filled 2017-08-29 (×2): qty 1

## 2017-08-29 MED ORDER — HYDROMORPHONE HCL 1 MG/ML IJ SOLN
0.2500 mg | INTRAMUSCULAR | Status: DC | PRN
  Administered 2017-08-29 (×4): 0.5 mg via INTRAVENOUS

## 2017-08-29 MED ORDER — DEXAMETHASONE SODIUM PHOSPHATE 10 MG/ML IJ SOLN
INTRAMUSCULAR | Status: AC
  Filled 2017-08-29: qty 1

## 2017-08-29 MED ORDER — DIPHENHYDRAMINE HCL 50 MG/ML IJ SOLN
INTRAMUSCULAR | Status: AC
  Filled 2017-08-29: qty 1

## 2017-08-29 MED ORDER — ONDANSETRON HCL 4 MG/2ML IJ SOLN: 4.0000 mg | INTRAMUSCULAR | Status: DC | PRN

## 2017-08-29 MED ORDER — MIDAZOLAM HCL 2 MG/2ML IJ SOLN
INTRAMUSCULAR | Status: DC | PRN
  Administered 2017-08-29: 2 mg via INTRAVENOUS

## 2017-08-29 MED ORDER — MAGNESIUM CITRATE PO SOLN: 1.0000 | Freq: Once | ORAL | Status: DC

## 2017-08-29 MED ORDER — SUGAMMADEX SODIUM 500 MG/5ML IV SOLN
INTRAVENOUS | Status: AC
  Filled 2017-08-29: qty 5

## 2017-08-29 MED ORDER — STERILE WATER FOR IRRIGATION IR SOLN
Status: DC | PRN
  Administered 2017-08-29: 1000 mL

## 2017-08-29 MED ORDER — OXYCODONE HCL 5 MG PO TABS
5.0000 mg | ORAL_TABLET | ORAL | Status: DC | PRN
  Administered 2017-08-29 – 2017-08-30 (×3): 5 mg via ORAL
  Filled 2017-08-29 (×3): qty 1

## 2017-08-29 MED ORDER — ZOLPIDEM TARTRATE 5 MG PO TABS
5.0000 mg | ORAL_TABLET | Freq: Every evening | ORAL | Status: DC | PRN
  Administered 2017-08-29 – 2017-08-30 (×2): 5 mg via ORAL
  Filled 2017-08-29 (×2): qty 1

## 2017-08-29 MED ORDER — SUFENTANIL CITRATE 50 MCG/ML IV SOLN
INTRAVENOUS | Status: DC | PRN
  Administered 2017-08-29: 5 ug via INTRAVENOUS
  Administered 2017-08-29 (×3): 10 ug via INTRAVENOUS
  Administered 2017-08-29: 5 ug via INTRAVENOUS
  Administered 2017-08-29: 10 ug via INTRAVENOUS
  Administered 2017-08-29: 5 ug via INTRAVENOUS
  Administered 2017-08-29: 20 ug via INTRAVENOUS

## 2017-08-29 MED ORDER — DIPHENHYDRAMINE HCL 50 MG/ML IJ SOLN
12.5000 mg | Freq: Four times a day (QID) | INTRAMUSCULAR | Status: DC | PRN
  Administered 2017-08-29: 12.5 mg via INTRAVENOUS
  Filled 2017-08-29: qty 1

## 2017-08-29 MED ORDER — PROPOFOL 10 MG/ML IV BOLUS
INTRAVENOUS | Status: AC
  Filled 2017-08-29: qty 20

## 2017-08-29 MED ORDER — ACETAMINOPHEN 500 MG PO TABS
1000.0000 mg | ORAL_TABLET | Freq: Four times a day (QID) | ORAL | Status: AC
  Administered 2017-08-29 – 2017-08-30 (×4): 1000 mg via ORAL
  Filled 2017-08-29 (×4): qty 2

## 2017-08-29 MED ORDER — PROMETHAZINE HCL 25 MG/ML IJ SOLN: 6.2500 mg | INTRAMUSCULAR | Status: DC | PRN

## 2017-08-29 MED ORDER — PROPOFOL 10 MG/ML IV BOLUS
INTRAVENOUS | Status: DC | PRN
  Administered 2017-08-29: 180 mg via INTRAVENOUS

## 2017-08-29 MED ORDER — SUGAMMADEX SODIUM 200 MG/2ML IV SOLN
INTRAVENOUS | Status: DC | PRN
  Administered 2017-08-29: 300 mg via INTRAVENOUS

## 2017-08-29 MED ORDER — HYDROMORPHONE HCL 1 MG/ML IJ SOLN
0.2500 mg | INTRAMUSCULAR | Status: DC | PRN
  Administered 2017-08-29 (×2): 0.5 mg via INTRAVENOUS

## 2017-08-29 MED ORDER — ROCURONIUM BROMIDE 10 MG/ML (PF) SYRINGE
PREFILLED_SYRINGE | INTRAVENOUS | Status: DC | PRN
  Administered 2017-08-29: 60 mg via INTRAVENOUS
  Administered 2017-08-29: 20 mg via INTRAVENOUS

## 2017-08-29 MED ORDER — MIDAZOLAM HCL 2 MG/2ML IJ SOLN
INTRAMUSCULAR | Status: AC
  Filled 2017-08-29: qty 2

## 2017-08-29 MED ORDER — SODIUM CHLORIDE 0.9 % IJ SOLN
INTRAMUSCULAR | Status: AC
  Filled 2017-08-29: qty 10

## 2017-08-29 MED ORDER — SODIUM CHLORIDE 0.9 % IJ SOLN
INTRAMUSCULAR | Status: DC | PRN
  Administered 2017-08-29: 20 mL

## 2017-08-29 SURGICAL SUPPLY — 59 items
ADH SKN CLS APL DERMABOND .7 (GAUZE/BANDAGES/DRESSINGS) ×2
BAG LAPAROSCOPIC 12 15 PORT 16 (BASKET) ×1 IMPLANT
BAG RETRIEVAL 12/15 (BASKET) ×2
BAG RETRIEVAL 12/15MM (BASKET) ×1
CHLORAPREP W/TINT 26ML (MISCELLANEOUS) ×3 IMPLANT
CLIP VESOLOCK LG 6/CT PURPLE (CLIP) ×3 IMPLANT
CLIP VESOLOCK MED LG 6/CT (CLIP) ×3 IMPLANT
CLIP VESOLOCK XL 6/CT (CLIP) ×3 IMPLANT
COVER SURGICAL LIGHT HANDLE (MISCELLANEOUS) ×3 IMPLANT
COVER TIP SHEARS 8 DVNC (MISCELLANEOUS) ×1 IMPLANT
COVER TIP SHEARS 8MM DA VINCI (MISCELLANEOUS) ×2
CUTTER ECHEON FLEX ENDO 45 340 (ENDOMECHANICALS) ×2 IMPLANT
DECANTER SPIKE VIAL GLASS SM (MISCELLANEOUS) ×3 IMPLANT
DERMABOND ADVANCED (GAUZE/BANDAGES/DRESSINGS) ×4
DERMABOND ADVANCED .7 DNX12 (GAUZE/BANDAGES/DRESSINGS) ×2 IMPLANT
DRAIN CHANNEL 15F RND FF 3/16 (WOUND CARE) IMPLANT
DRAPE ARM DVNC X/XI (DISPOSABLE) ×4 IMPLANT
DRAPE COLUMN DVNC XI (DISPOSABLE) ×1 IMPLANT
DRAPE DA VINCI XI ARM (DISPOSABLE) ×8
DRAPE DA VINCI XI COLUMN (DISPOSABLE) ×2
DRAPE INCISE IOBAN 66X45 STRL (DRAPES) ×3 IMPLANT
DRAPE LAPAROSCOPIC ABDOMINAL (DRAPES) ×3 IMPLANT
DRAPE SHEET LG 3/4 BI-LAMINATE (DRAPES) ×3 IMPLANT
ELECT PENCIL ROCKER SW 15FT (MISCELLANEOUS) ×3 IMPLANT
ELECT REM PT RETURN 15FT ADLT (MISCELLANEOUS) ×6 IMPLANT
EVACUATOR SILICONE 100CC (DRAIN) IMPLANT
GLOVE BIO SURGEON STRL SZ 6.5 (GLOVE) ×2 IMPLANT
GLOVE BIO SURGEONS STRL SZ 6.5 (GLOVE) ×1
GLOVE BIOGEL M STRL SZ7.5 (GLOVE) ×6 IMPLANT
GOWN STRL REUS W/TWL LRG LVL3 (GOWN DISPOSABLE) ×9 IMPLANT
IRRIG SUCT STRYKERFLOW 2 WTIP (MISCELLANEOUS) ×3
IRRIGATION SUCT STRKRFLW 2 WTP (MISCELLANEOUS) ×1 IMPLANT
KIT BASIN OR (CUSTOM PROCEDURE TRAY) ×3 IMPLANT
LOOP VESSEL MAXI BLUE (MISCELLANEOUS) ×3 IMPLANT
NDL INSUFFLATION 14GA 120MM (NEEDLE) ×1 IMPLANT
NEEDLE INSUFFLATION 14GA 120MM (NEEDLE) ×3 IMPLANT
PORT ACCESS TROCAR AIRSEAL 12 (TROCAR) ×1 IMPLANT
PORT ACCESS TROCAR AIRSEAL 5M (TROCAR) ×2
POSITIONER SURGICAL ARM (MISCELLANEOUS) ×6 IMPLANT
RELOAD STAPLE 45 2.6 WHT THIN (STAPLE) IMPLANT
SEAL CANN UNIV 5-8 DVNC XI (MISCELLANEOUS) ×4 IMPLANT
SEAL XI 5MM-8MM UNIVERSAL (MISCELLANEOUS) ×8
SET TRI-LUMEN FLTR TB AIRSEAL (TUBING) ×3 IMPLANT
SOLUTION ELECTROLUBE (MISCELLANEOUS) ×3 IMPLANT
SPONGE LAP 4X18 X RAY DECT (DISPOSABLE) ×3 IMPLANT
STAPLE RELOAD 45 WHT (STAPLE) ×5 IMPLANT
STAPLE RELOAD 45MM WHITE (STAPLE) ×15
SUT ETHILON 3 0 PS 1 (SUTURE) IMPLANT
SUT MNCRL AB 4-0 PS2 18 (SUTURE) ×6 IMPLANT
SUT PDS AB 1 CT1 27 (SUTURE) ×9 IMPLANT
SUT VICRYL 0 UR6 27IN ABS (SUTURE) IMPLANT
TOWEL OR 17X26 10 PK STRL BLUE (TOWEL DISPOSABLE) ×3 IMPLANT
TOWEL OR NON WOVEN STRL DISP B (DISPOSABLE) ×6 IMPLANT
TRAY FOLEY W/METER SILVER 16FR (SET/KITS/TRAYS/PACK) ×3 IMPLANT
TRAY LAPAROSCOPIC (CUSTOM PROCEDURE TRAY) ×3 IMPLANT
TROCAR BLADELESS OPT 12M 100M (ENDOMECHANICALS) ×3 IMPLANT
TROCAR BLADELESS OPT 5 100 (ENDOMECHANICALS) IMPLANT
TROCAR XCEL 12X100 BLDLESS (ENDOMECHANICALS) ×3 IMPLANT
WATER STERILE IRR 1000ML POUR (IV SOLUTION) ×6 IMPLANT

## 2017-08-29 NOTE — Anesthesia Postprocedure Evaluation (Signed)
Anesthesia Post Note  Patient: Deborah Higgins  Procedure(s) Performed: XI ROBOTIC ASSISTED LAPAROSCOPIC NEPHRECTOMY (Left )     Patient location during evaluation: PACU Anesthesia Type: General Level of consciousness: awake and alert Pain management: pain level controlled Vital Signs Assessment: post-procedure vital signs reviewed and stable Respiratory status: spontaneous breathing, nonlabored ventilation, respiratory function stable and patient connected to nasal cannula oxygen Cardiovascular status: blood pressure returned to baseline and stable Postop Assessment: no apparent nausea or vomiting Anesthetic complications: no    Last Vitals:  Vitals:   08/29/17 1445 08/29/17 1512  BP: (!) 154/87 (!) 145/80  Pulse: 99 (!) 105  Resp: 14 14  Temp: 37.2 C 37.6 C  SpO2: 100% 99%    Last Pain:  Vitals:   08/29/17 1514  TempSrc:   PainSc: 5                  Tiajuana Amass

## 2017-08-29 NOTE — Discharge Instructions (Signed)

## 2017-08-29 NOTE — Anesthesia Preprocedure Evaluation (Addendum)
Anesthesia Evaluation  Patient identified by MRN, date of birth, ID band Patient awake    Reviewed: Allergy & Precautions, NPO status , Patient's Chart, lab work & pertinent test results  Airway Mallampati: II  TM Distance: >3 FB Neck ROM: Full    Dental  (+) Dental Advisory Given   Pulmonary neg pulmonary ROS,    breath sounds clear to auscultation       Cardiovascular negative cardio ROS   Rhythm:Regular Rate:Normal     Neuro/Psych negative neurological ROS     GI/Hepatic negative GI ROS, Neg liver ROS,   Endo/Other  negative endocrine ROS  Renal/GU negative Renal ROS     Musculoskeletal   Abdominal (+) + obese,   Peds  Hematology  (+) anemia ,   Anesthesia Other Findings   Reproductive/Obstetrics                             Lab Results  Component Value Date   WBC 9.2 08/21/2017   HGB 10.6 (L) 08/21/2017   HCT 34.1 (L) 08/21/2017   MCV 77.9 (L) 08/21/2017   PLT 468 (H) 08/21/2017   Lab Results  Component Value Date   CREATININE 0.59 08/21/2017   BUN 15 08/21/2017   NA 136 08/21/2017   K 4.2 08/21/2017   CL 104 08/21/2017   CO2 25 08/21/2017    Anesthesia Physical Anesthesia Plan  ASA: II  Anesthesia Plan: General   Post-op Pain Management:    Induction: Intravenous  PONV Risk Score and Plan: 3 and Midazolam, Dexamethasone, Ondansetron and Treatment may vary due to age or medical condition  Airway Management Planned: Oral ETT  Additional Equipment:   Intra-op Plan:   Post-operative Plan: Extubation in OR  Informed Consent: I have reviewed the patients History and Physical, chart, labs and discussed the procedure including the risks, benefits and alternatives for the proposed anesthesia with the patient or authorized representative who has indicated his/her understanding and acceptance.   Dental advisory given  Plan Discussed with: CRNA  Anesthesia Plan  Comments:         Anesthesia Quick Evaluation

## 2017-08-29 NOTE — Brief Op Note (Signed)
08/29/2017  1:08 PM  PATIENT:  Verdene Lennert Moreno-Flores  42 y.o. female  PRE-OPERATIVE DIAGNOSIS:  LEFT RENAL MASS  POST-OPERATIVE DIAGNOSIS:  LEFT RENAL MASS  PROCEDURE:  Procedure(s): XI ROBOTIC ASSISTED LAPAROSCOPIC NEPHRECTOMY (Left)  SURGEON:  Surgeon(s) and Role:    Alexis Frock, MD - Primary  PHYSICIAN ASSISTANT:   ASSISTANTS: Clemetine Marker PA   ANESTHESIA:   local and general  EBL:  50 mL   BLOOD ADMINISTERED:none  DRAINS: foley to gravity   LOCAL MEDICATIONS USED:  MARCAINE     SPECIMEN:  Source of Specimen:  Left kidney  DISPOSITION OF SPECIMEN:  PATHOLOGY  COUNTS:  YES  TOURNIQUET:  * No tourniquets in log *  DICTATION: .Other Dictation: Dictation Number [id number]  PLAN OF CARE: Admit to inpatient   PATIENT DISPOSITION:  PACU - hemodynamically stable.   Delay start of Pharmacological VTE agent (>24hrs) due to surgical blood loss or risk of bleeding: yes

## 2017-08-29 NOTE — H&P (Signed)
Deborah Higgins is an 41 y.o. female.    Chief Complaint: PRe-op LEFT Radical Nephrectomy  HPI:   1 -Left Renal Mass - 4.4cm left lower pole cystic, enhancing mass that abuts central hilar fat and renal pelvis by MRI 06/2017, incidental during hospitalization gastroenteritis. Mass is solitary, lower pole 60% endophytic, medial. 1 artery / 1 vein left renovascular anatomy. Normal contralateral kidney. Baseline Cr 0.8.   PMH sig for obesity, cesarean. NO ischemic CV disease / blood thinners. 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   Today "Deborah Higgins" is seen to proceed with LEFT radical nephrectomy.    Past Medical History:  Diagnosis Date  . Complication of anesthesia    a lot of rashes on skin when had C-section    Past Surgical History:  Procedure Laterality Date  . CESAREAN SECTION      No family history on file. Social History:  reports that  has never smoked. she has never used smokeless tobacco. She reports that she does not drink alcohol or use drugs.  Allergies: No Known Allergies  No medications prior to admission.    No results found for this or any previous visit (from the past 48 hour(s)). No results found.  Review of Systems  Constitutional: Negative.  Negative for chills and fever.  HENT: Negative.   Eyes: Negative.   Respiratory: Negative.   Cardiovascular: Negative.   Gastrointestinal: Negative.   Genitourinary: Negative.   Musculoskeletal: Negative.   Skin: Negative.   Neurological: Negative.   Endo/Heme/Allergies: Negative.   Psychiatric/Behavioral: Negative.     Last menstrual period 08/20/2017. Physical Exam  Constitutional: She appears well-developed.  Seen with interpreter.   HENT:  Head: Normocephalic.  Eyes: Pupils are equal, round, and reactive to light.  Neck: Normal range of motion.  Cardiovascular: Normal rate.  Respiratory: Effort normal.  GI: Soft.  Stable large truncal obesity.   Genitourinary:   Genitourinary Comments: NO CVAT  Musculoskeletal: Normal range of motion.  Neurological: She is alert.  Skin: Skin is warm.  Psychiatric: She has a normal mood and affect.     Assessment/Plan  1 -Left Renal Mass - proceed as planned with LEFT radical nephrectomy. Risks, benefits, alternatives, expected peri-op course discussed previously and reiterated today.   Alexis Frock, MD 08/29/2017, 8:26 AM

## 2017-08-29 NOTE — Transfer of Care (Signed)
Immediate Anesthesia Transfer of Care Note  Patient: Deborah Higgins  Procedure(s) Performed: XI ROBOTIC ASSISTED LAPAROSCOPIC NEPHRECTOMY (Left )  Patient Location: PACU  Anesthesia Type:General  Level of Consciousness: awake and alert   Airway & Oxygen Therapy: Patient Spontanous Breathing and Patient connected to face mask oxygen  Post-op Assessment: Report given to RN and Post -op Vital signs reviewed and stable  Post vital signs: Reviewed and stable  Last Vitals:  Vitals:   08/29/17 1001  BP: (!) 160/75  Pulse: 73  Resp: 16  Temp: 37.3 C  SpO2: 100%    Last Pain:  Vitals:   08/29/17 1001  TempSrc: Oral      Patients Stated Pain Goal: 4 (45/85/92 9244)  Complications: No apparent anesthesia complications

## 2017-08-29 NOTE — Anesthesia Procedure Notes (Signed)
Procedure Name: Intubation Date/Time: 08/29/2017 11:26 AM Performed by: Sharlette Dense, CRNA Patient Re-evaluated:Patient Re-evaluated prior to induction Oxygen Delivery Method: Circle system utilized Preoxygenation: Pre-oxygenation with 100% oxygen Induction Type: IV induction Ventilation: Mask ventilation without difficulty and Oral airway inserted - appropriate to patient size Laryngoscope Size: Sabra Heck and 2 Grade View: Grade I Tube type: Oral Tube size: 7.5 mm Number of attempts: 1 Airway Equipment and Method: Stylet Placement Confirmation: ETT inserted through vocal cords under direct vision,  positive ETCO2 and breath sounds checked- equal and bilateral Secured at: 21 cm Tube secured with: Tape Dental Injury: Teeth and Oropharynx as per pre-operative assessment

## 2017-08-29 NOTE — Progress Notes (Signed)
Dr. Tresa Moore notified that pt did not drink magnesium citrate yesterday as instructed.  Notified him that she drank clear liquids only after 1pm yesterday and was NPO after midnight.

## 2017-08-30 LAB — BASIC METABOLIC PANEL
Anion gap: 9 (ref 5–15)
BUN: 13 mg/dL (ref 6–20)
CALCIUM: 9 mg/dL (ref 8.9–10.3)
CHLORIDE: 104 mmol/L (ref 101–111)
CO2: 24 mmol/L (ref 22–32)
CREATININE: 0.99 mg/dL (ref 0.44–1.00)
GFR calc Af Amer: >60 mL/min (ref >60)
GFR calc non Af Amer: >60 mL/min (ref >60)
Glucose, Bld: 142 mg/dL — ABNORMAL HIGH (ref 65–99)
Potassium: 4.2 mmol/L (ref 3.5–5.1)
SODIUM: 137 mmol/L (ref 135–145)

## 2017-08-30 LAB — HEMOGLOBIN AND HEMATOCRIT, BLOOD
HCT: 30 % — ABNORMAL LOW (ref 36.0–46.0)
HCT: 30 % — ABNORMAL LOW (ref 36.0–46.0)
HEMOGLOBIN: 9.2 g/dL — AB (ref 12.0–15.0)
Hemoglobin: 9.3 g/dL — ABNORMAL LOW (ref 12.0–15.0)

## 2017-08-30 MED ORDER — ACETAMINOPHEN 500 MG PO TABS: 500.0000 mg | ORAL_TABLET | Freq: Four times a day (QID) | ORAL | Status: DC | PRN

## 2017-08-30 MED ORDER — ACETAMINOPHEN 325 MG PO TABS: 325.0000 mg | ORAL_TABLET | Freq: Four times a day (QID) | ORAL | Status: DC | PRN

## 2017-08-30 MED ORDER — HYDROCODONE-ACETAMINOPHEN 5-325 MG PO TABS
1.0000 | ORAL_TABLET | ORAL | Status: DC | PRN
  Administered 2017-08-30: 1 via ORAL
  Administered 2017-08-30 – 2017-08-31 (×2): 2 via ORAL
  Filled 2017-08-30: qty 2
  Filled 2017-08-30: qty 1
  Filled 2017-08-30: qty 2

## 2017-08-30 NOTE — Progress Notes (Signed)
Translator services used to ask and answer pt questions during morning assessment.

## 2017-08-30 NOTE — Op Note (Signed)
NAMEMarland Kitchen  [name], GULLY NO.:  [id number]  MEDICAL RECORD NO.:  [id number]  LOCATION:                                 FACILITY:  PHYSICIAN:  Alexis Frock, MD          DATE OF BIRTH:  DATE OF PROCEDURE: DATE OF DISCHARGE:                              OPERATIVE REPORT   DIAGNOSIS:  Left renal mass.  PROCEDURE:  Left robotic radical nephrectomy.  ESTIMATED BLOOD LOSS:  50 mL.  COMPLICATION:  None.  SPECIMEN:  Left radical nephrectomy.  ASSISTANT:  Debbrah Alar, PA.  FINDINGS: 1. Single artery, single vein, left renovascular anatomy as     anticipated. 2. Endophytic lower pole left renal mass.  INDICATION:  [name] is a very pleasant 42 year old lady, who was found incidentally during hospitalization for upper abdominal pain to have a left lower pole solid enhancing renal mass.  The orientation of the mass was quite endophytic, encroaching upon hilar fat, raising suspicion of possible T3 disease.  Options were discussed including surveillance protocols versus surgical extirpation with radical nephrectomy being the preferred and recommended path and she wished to proceed and informed consent was obtained and placed in the medical record.  PROCEDURE IN DETAIL:  The patient being Deborah Higgins was verified.  The procedure being left radical nephrectomy was confirmed. The procedure was carried out.  Intravenous antibiotics were administered.  General endotracheal anesthesia introduced.  The patient was placed into a left side up full flank position, employing 15 degrees of stable flexion, superior arm elevator, sequential compression devices and axillary roll.  Bottom leg bit bent, top leg straight.  She was further fashioned to the operating table using 3-inch tape over foam padding across her supraxiphoid chest and her pelvis.  Sterile field was created by prepping and draping the patient's entire abdomen and left flank using  chlorhexidine gluconate.  Foley catheter previously had been placed, which was connected to a straight drain.  Next, a high-flow, low- pressure pneumoperitoneum was obtained using Veress technique in the left lower quadrant having passed the aspiration and drop test.  An 8 mm robotic camera was placed in position approximately 1 handbreadth superolateral to the umbilicus.  Laparoscopic examination of the peritoneal cavity revealed no significant adhesions and no visceral injury.  Additional ports were placed as follows; left subcostal 8-mm robotic port, left far lateral 8-mm robotic port approximately 4 fingerbreadths superomedial to the anterior iliac spine.  An 8 mm inferior paramedian robotic port approximately 1 handbreadth superior to the pubic ramus and two 12 mm assistant port sites in the midline, one in the supraumbilical crease and another one at 2 fingerbreadths above the camera port site, being an air seal type.  Robot was docked and passed through the electronic checks.  Initial attention was directed at development of the left retroperitoneum.  Incision was made lateral to the descending colon.  The area of the splenic flexure towards the area of the internal ring.  The colon was carefully swept medially.  Lower pole of the kidney and area introitus fascia were placed on gentle lateral traction.  Dissection proceeded medial to this.  The ureter and gonadal vessels were encountered and placed on gentle  lateral traction. Dissection proceeded in the triangle of the ureters, gonadal vessels, aorta, and psoas musculature toward the area of the renal hilum.  Renal hilum consisted of single artery, single vein, renovascular anatomy as anticipated.  There was a dominant inferior branch renal noted and the artery was controlled using extra-large Hem-O-Lok clip proximally followed by vascular stapler distally.  The vein was controlled just proximal to the takeoff of the adrenal branch  with vascular stapler digital for an excellent hemostatic control of hilum.  Additional vascular stapler was placed on the medial aspect of the adrenal gland. Separating the medial adrenal blood supply.  Superior attachments were taken down with cautery scissors as were lateral attachments.  The ureter was doubly clipped and ligated.  The gonadal vessels were ligated with vascular stapler, was completely freed up the left radical nephrectomy specimen, which was placed into an EndoCatch bag for later retrieval.  Inspection of the hilum revealed excellent hemostasis.  All sponge and needle counts were correct.  Robot was undocked.  Specimen was retrieved by extending the previous assistant port sites in the midline and removing the radical nephrectomy specimen and setting it aside for permanent pathology.  The extraction site was closed at the level of the fascia using figure-of-eight PDS x6, reapproximation of Scarpa's with running Vicryl.  All incision sites were infiltrated with dilute lyophilized Marcaine and closed at the level of the skin using subcuticular Monocryl followed by Dermabond.  The procedure was terminated and the patient tolerated the procedure well.  There were no immediate periprocedural complications.  The patient was taken to the postanesthesia care in stable condition.  Please note, first assistant, Debbrah Alar, was absolutely crucial for all robotic portions of the procedure today.  She provided invaluable vascular clipping, vascular stapling, retraction, suction ing, specimen manipulation; without which, this would not be possible.    ______________________________ Alexis Frock, MD   ______________________________ Alexis Frock, MD    TM/MEDQ  D:  08/29/2017  T:  08/29/2017  Job:  [id number]

## 2017-08-30 NOTE — Progress Notes (Signed)
1 Day Post-Op Subjective: Patient reports some dizziness with ambulation.  Her pain is moderately controlled.  She has only ambulated twice today.  She is voiding without difficulty.  Has passed flatus and is hungry.  She does not speak English well and most communication is carried out via family members.   Objective: Vital signs in last 24 hours: Temp:  [97.9 F (36.6 C)-99.3 F (37.4 C)] 98.4 F (36.9 C) (03/21 1403) Pulse Rate:  [62-79] 71 (03/21 1403) Resp:  [16-18] 18 (03/21 1403) BP: (118-140)/(53-68) 140/61 (03/21 1403) SpO2:  [96 %-100 %] 100 % (03/21 1403)  Intake/Output from previous day: 03/20 0701 - 03/21 0700 In: 2085 [I.V.:1985; IV Piggyback:100] Out: 5465 [Urine:1775; Blood:50] Intake/Output this shift: Total I/O In: -  Out: 1400 [Urine:1400]  Physical Exam:  General:alert, cooperative and no distress Cardiovascular: RRR Lungs: CTA bilateral GI: soft, mildly tender, normal bowel sounds, no palpable masses Incisions: C/D/I Urine:clear   Lab Results: Recent Labs    08/30/17 0526 08/30/17 1054  HGB 9.2* 9.3*  HCT 30.0* 30.0*   BMET Recent Labs    08/30/17 0526  NA 137  K 4.2  CL 104  CO2 24  GLUCOSE 142*  BUN 13  CREATININE 0.99  CALCIUM 9.0   No results for input(s): LABPT, INR in the last 72 hours. No results for input(s): LABURIN in the last 72 hours. Results for orders placed or performed during the hospital encounter of 06/14/17  Culture, Urine     Status: None   Collection Time: 06/15/17  3:00 AM  Result Value Ref Range Status   Specimen Description URINE, CLEAN CATCH  Final   Special Requests NONE  Final   Culture NO GROWTH  Final   Report Status 06/16/2017 FINAL  Final    Studies/Results: No results found.  Assessment/Plan: 1 Day Post-Op, Procedure(s) (LRB): XI ROBOTIC ASSISTED LAPAROSCOPIC NEPHRECTOMY (Left)   Pt is doing well POD 1.    Ambulate, Incentive spirometry DVT prophylaxis Advance diet  Plan for d/c in am   LOS: 1 day   Debbrah Alar 08/30/2017, 5:26 PM

## 2017-08-31 NOTE — Care Management Note (Signed)
Case Management Note  Patient Details  Name: Deborah Higgins MRN: [id number] Date of Birth: January 01, 1976  Subjective/Objective:                    Action/Plan:d/c home.   Expected Discharge Date:  08/31/17               Expected Discharge Plan:  Home/Self Care  In-House Referral:     Discharge planning Services  CM Consult  Post Acute Care Choice:    Choice offered to:     DME Arranged:    DME Agency:     HH Arranged:    HH Agency:     Status of Service:  Completed, signed off  If discussed at [employer] of Stay Meetings, dates discussed:    Additional Comments:  Dessa Phi, RN 08/31/2017, 8:39 AM

## 2017-08-31 NOTE — Progress Notes (Signed)
Translator services, with [employer] as the translator, was used to assessment and discharge papers.  Went over all medications and discharge paperwork with patient.  All questions answered.  VSS.  PT discharged via wheelchair.

## 2017-08-31 NOTE — Discharge Summary (Signed)
  Date of admission: 08/29/2017  Date of discharge: 08/31/2017  Admission diagnosis: Left renal mass  Discharge diagnosis: same  Secondary diagnoses: obesity  History and Physical: For full details, please see admission history and physical. Briefly, Deborah Higgins is a 42 y.o. year old patient with a Left Renal Mass - 4.4cm left lower pole cystic, enhancing mass that abuts central hilar fat and renal pelvis by MRI 06/2017, incidental during hospitalization gastroenteritis. Mass is solitary, lower pole 60% endophytic, medial. 1 artery / 1 vein left renovascular anatomy. Normal contralateral kidney. Baseline Cr 0.8.   Hospital Course: Pt was admitted and taken to the OR on 08/29/17 for a robotic assisted lap left radical nephrectomy.  Pt tolerated the procedure well and was hemodynamically stable throughout the procedure.  She was extubated without complication and woke up from anesthesia neurologically intact.  She was transferred from the OR to PACU and then to the floor without difficulty.  Her post op course progressed as expected.  Foley was removed on POD 1 and she was able to void without issue.  She began ambulating and using IS on POD 1 as well.  Pain was well controlled and she was switched to oral pain meds on POD 1.  She began passing flatus and had a small BM on the evening of POD 1. Incisions remained C/D/I and abdomen soft with no distention.  By POD 2 she was ambulating, tolerating a soft diet, and felt stable for d/c home.    Laboratory values:  Recent Labs    08/30/17 0526 08/30/17 1054  HGB 9.2* 9.3*  HCT 30.0* 30.0*   Recent Labs    08/30/17 0526  CREATININE 0.99    Disposition: Home  Discharge instruction: The patient was instructed to be ambulatory but told to refrain from heavy lifting, strenuous activity, or driving.   Discharge medications:  Allergies as of 08/31/2017   No Known Allergies     Medication List    TAKE these medications   ferrous  sulfate 325 (65 FE) MG tablet Take 1 tablet (325 mg total) by mouth daily with breakfast.   HYDROcodone-acetaminophen 5-325 MG tablet Commonly known as:  NORCO Take 1-2 tablets by mouth every 6 (six) hours as needed for moderate pain or severe pain.       Followup:  Follow-up Information    Alexis Frock, MD On 09/13/2017.   Specialty:  Urology Why:  at 10:15 AM for MD visit.  Contact information: [address] [place] [address] [phone]

## 2018-02-01 ENCOUNTER — Ambulatory Visit: Payer: Self-pay

## 2020-05-01 ENCOUNTER — Observation Stay (HOSPITAL_COMMUNITY): Payer: Self-pay

## 2020-05-01 ENCOUNTER — Observation Stay (HOSPITAL_COMMUNITY)
Admission: EM | Admit: 2020-05-01 | Discharge: 2020-05-02 | Disposition: A | Discharge status: Home / Self Care | Payer: Self-pay | Attending: Obstetrics and Gynecology | Admitting: Obstetrics and Gynecology

## 2020-05-01 ENCOUNTER — Emergency Department (HOSPITAL_COMMUNITY): Payer: Self-pay

## 2020-05-01 ENCOUNTER — Other Ambulatory Visit: Payer: Self-pay

## 2020-05-01 ENCOUNTER — Encounter (HOSPITAL_COMMUNITY): Payer: Self-pay | Admitting: Emergency Medicine

## 2020-05-01 ENCOUNTER — Other Ambulatory Visit (HOSPITAL_COMMUNITY): Payer: Self-pay

## 2020-05-01 DIAGNOSIS — D259 Leiomyoma of uterus, unspecified: Secondary | ICD-10-CM | POA: Insufficient documentation

## 2020-05-01 DIAGNOSIS — Z20822 Contact with and (suspected) exposure to covid-19: Secondary | ICD-10-CM | POA: Insufficient documentation

## 2020-05-01 DIAGNOSIS — R519 Headache, unspecified: Secondary | ICD-10-CM

## 2020-05-01 DIAGNOSIS — D500 Iron deficiency anemia secondary to blood loss (chronic): Principal | ICD-10-CM | POA: Insufficient documentation

## 2020-05-01 DIAGNOSIS — D649 Anemia, unspecified: Secondary | ICD-10-CM | POA: Diagnosis present

## 2020-05-01 DIAGNOSIS — Z79899 Other long term (current) drug therapy: Secondary | ICD-10-CM | POA: Insufficient documentation

## 2020-05-01 DIAGNOSIS — N939 Abnormal uterine and vaginal bleeding, unspecified: Secondary | ICD-10-CM | POA: Insufficient documentation

## 2020-05-01 LAB — URINALYSIS, ROUTINE W REFLEX MICROSCOPIC
Bilirubin Urine: NEGATIVE
Glucose, UA: NEGATIVE mg/dL
Ketones, ur: NEGATIVE mg/dL
Leukocytes,Ua: NEGATIVE
Nitrite: NEGATIVE
Protein, ur: NEGATIVE mg/dL
RBC / HPF: >50 RBC/hpf — ABNORMAL HIGH (ref 0–5)
Specific Gravity, Urine: 1.014 (ref 1.005–1.030)
pH: 5 (ref 5.0–8.0)

## 2020-05-01 LAB — DIC (DISSEMINATED INTRAVASCULAR COAGULATION)PANEL
D-Dimer, Quant: 0.85 ug/mL-FEU — ABNORMAL HIGH (ref 0.00–0.50)
Fibrinogen: 262 mg/dL (ref 210–475)
INR: 1.2 (ref 0.8–1.2)
Platelets: 466 10*3/uL — ABNORMAL HIGH (ref 150–400)
Prothrombin Time: 14.7 seconds (ref 11.4–15.2)
Smear Review: NONE SEEN
aPTT: 28 seconds (ref 24–36)

## 2020-05-01 LAB — CBC
HCT: 15.7 % — ABNORMAL LOW (ref 36.0–46.0)
Hemoglobin: 3.5 g/dL — CL (ref 12.0–15.0)
MCH: 12.9 pg — ABNORMAL LOW (ref 26.0–34.0)
MCHC: 22.3 g/dL — ABNORMAL LOW (ref 30.0–36.0)
MCV: 57.9 fL — ABNORMAL LOW (ref 80.0–100.0)
Platelets: 601 10*3/uL — ABNORMAL HIGH (ref 150–400)
RBC: 2.71 MIL/uL — ABNORMAL LOW (ref 3.87–5.11)
RDW: 21.5 % — ABNORMAL HIGH (ref 11.5–15.5)
WBC: 11.3 10*3/uL — ABNORMAL HIGH (ref 4.0–10.5)
nRBC: 1.7 % — ABNORMAL HIGH (ref 0.0–0.2)

## 2020-05-01 LAB — I-STAT CHEM 8, ED
BUN: 15 mg/dL (ref 6–20)
Calcium, Ion: 1.24 mmol/L (ref 1.15–1.40)
Chloride: 105 mmol/L (ref 98–111)
Creatinine, Ser: 0.9 mg/dL (ref 0.44–1.00)
Glucose, Bld: 108 mg/dL — ABNORMAL HIGH (ref 70–99)
HCT: 15 % — ABNORMAL LOW (ref 36.0–46.0)
Hemoglobin: 5.1 g/dL — CL (ref 12.0–15.0)
Potassium: 4 mmol/L (ref 3.5–5.1)
Sodium: 139 mmol/L (ref 135–145)
TCO2: 21 mmol/L — ABNORMAL LOW (ref 22–32)

## 2020-05-01 LAB — COMPREHENSIVE METABOLIC PANEL
ALT: 14 U/L (ref 0–44)
AST: 16 U/L (ref 15–41)
Albumin: 3.2 g/dL — ABNORMAL LOW (ref 3.5–5.0)
Alkaline Phosphatase: 66 U/L (ref 38–126)
Anion gap: 11 (ref 5–15)
BUN: 15 mg/dL (ref 6–20)
CO2: 21 mmol/L — ABNORMAL LOW (ref 22–32)
Calcium: 9 mg/dL (ref 8.9–10.3)
Chloride: 105 mmol/L (ref 98–111)
Creatinine, Ser: 1.01 mg/dL — ABNORMAL HIGH (ref 0.44–1.00)
GFR, Estimated: >60 mL/min (ref >60)
Glucose, Bld: 113 mg/dL — ABNORMAL HIGH (ref 70–99)
Potassium: 4 mmol/L (ref 3.5–5.1)
Sodium: 137 mmol/L (ref 135–145)
Total Bilirubin: 0.5 mg/dL (ref 0.3–1.2)
Total Protein: 7.1 g/dL (ref 6.5–8.1)

## 2020-05-01 LAB — PROTIME-INR
INR: 1.1 (ref 0.8–1.2)
Prothrombin Time: 14 seconds (ref 11.4–15.2)

## 2020-05-01 LAB — RESPIRATORY PANEL BY RT PCR (FLU A&B, COVID)
Influenza A by PCR: NEGATIVE
Influenza B by PCR: NEGATIVE
SARS Coronavirus 2 by RT PCR: NEGATIVE

## 2020-05-01 LAB — IRON AND TIBC
Iron: 8 ug/dL — ABNORMAL LOW (ref 28–170)
Saturation Ratios: 1 % — ABNORMAL LOW (ref 10.4–31.8)
TIBC: 553 ug/dL — ABNORMAL HIGH (ref 250–450)
UIBC: 545 ug/dL

## 2020-05-01 LAB — LACTIC ACID, PLASMA
Lactic Acid, Venous: 3.3 mmol/L (ref 0.5–1.9)
Lactic Acid, Venous: 1.2 mmol/L (ref 0.5–1.9)

## 2020-05-01 LAB — RETICULOCYTES
Immature Retic Fract: 29.2 % — ABNORMAL HIGH (ref 2.3–15.9)
RBC.: 2.71 MIL/uL — ABNORMAL LOW (ref 3.87–5.11)
Retic Count, Absolute: 107.9 10*3/uL (ref 19.0–186.0)
Retic Ct Pct: 4 % — ABNORMAL HIGH (ref 0.4–3.1)

## 2020-05-01 LAB — FERRITIN: Ferritin: 1 ng/mL — ABNORMAL LOW (ref 11–307)

## 2020-05-01 LAB — I-STAT BETA HCG BLOOD, ED (MC, WL, AP ONLY): I-stat hCG, quantitative: <5 m[IU]/mL (ref <5)

## 2020-05-01 LAB — VITAMIN B12: Vitamin B-12: 269 pg/mL (ref 180–914)

## 2020-05-01 LAB — FOLATE: Folate: 21.6 ng/mL (ref >5.9)

## 2020-05-01 LAB — PREPARE RBC (CROSSMATCH)

## 2020-05-01 MED ORDER — ZOLPIDEM TARTRATE 5 MG PO TABS: 5.0000 mg | ORAL_TABLET | Freq: Every evening | ORAL | Status: DC | PRN

## 2020-05-01 MED ORDER — OXYCODONE-ACETAMINOPHEN 5-325 MG PO TABS: 1.0000 | ORAL_TABLET | Freq: Four times a day (QID) | ORAL | Status: DC | PRN

## 2020-05-01 MED ORDER — MEGESTROL ACETATE 40 MG PO TABS
40.0000 mg | ORAL_TABLET | Freq: Two times a day (BID) | ORAL | Status: DC
  Administered 2020-05-02 (×2): 40 mg via ORAL
  Filled 2020-05-01 (×4): qty 1

## 2020-05-01 MED ORDER — ONDANSETRON HCL 4 MG PO TABS: 4.0000 mg | ORAL_TABLET | Freq: Four times a day (QID) | ORAL | Status: DC | PRN

## 2020-05-01 MED ORDER — ESTROGENS CONJUGATED 25 MG IJ SOLR
25.0000 mg | Freq: Four times a day (QID) | INTRAMUSCULAR | Status: DC
  Administered 2020-05-02 (×4): 25 mg via INTRAVENOUS
  Filled 2020-05-01 (×8): qty 25

## 2020-05-01 MED ORDER — SODIUM CHLORIDE 0.9 % IV SOLN: 10.0000 mL/h | Freq: Once | INTRAVENOUS | Status: DC

## 2020-05-01 MED ORDER — ALUM & MAG HYDROXIDE-SIMETH 200-200-20 MG/5ML PO SUSP: 30.0000 mL | ORAL | Status: DC | PRN

## 2020-05-01 MED ORDER — LACTATED RINGERS IV SOLN: INTRAVENOUS | Status: DC

## 2020-05-01 MED ORDER — SENNA 8.6 MG PO TABS
1.0000 | ORAL_TABLET | Freq: Two times a day (BID) | ORAL | Status: DC
  Administered 2020-05-02 (×2): 8.6 mg via ORAL
  Filled 2020-05-01 (×2): qty 1

## 2020-05-01 MED ORDER — ACETAMINOPHEN 325 MG PO TABS
650.0000 mg | ORAL_TABLET | ORAL | Status: DC | PRN
  Administered 2020-05-02: 650 mg via ORAL
  Filled 2020-05-01: qty 2

## 2020-05-01 MED ORDER — TRANEXAMIC ACID-NACL 1000-0.7 MG/100ML-% IV SOLN
1000.0000 mg | Freq: Once | INTRAVENOUS | Status: AC
  Administered 2020-05-02: 1000 mg via INTRAVENOUS
  Filled 2020-05-01 (×2): qty 100

## 2020-05-01 MED ORDER — ONDANSETRON HCL 4 MG/2ML IJ SOLN: 4.0000 mg | Freq: Four times a day (QID) | INTRAMUSCULAR | Status: DC | PRN

## 2020-05-01 MED ORDER — ACETAMINOPHEN 325 MG PO TABS: 650.0000 mg | ORAL_TABLET | Freq: Once | ORAL | Status: DC

## 2020-05-01 NOTE — ED Provider Notes (Signed)
[facility] EMERGENCY DEPARTMENT Provider Note   CSN: [id number] Arrival date & time: 05/01/20  1358     History Chief Complaint  Patient presents with  . Dizziness  . Nausea    Deborah Higgins is a 44 y.o. female.  HPI   Pt has been having trouble with nausea, vomiting, headaches, dizziness and chills .It started about three months ago.  Pt decided to come to the ED today because her headache is getting stronger.  She is fatigued and is having aches.  SHe is getting so fatigued it is hard to do anything now.  The nosebleeds are mild.  She denies blood in her stool.  No dark stools.  She is having heavy menses, ongoing for several months.  SHe is having menstrual bleeding now.  Past Medical History:  Diagnosis Date  . Complication of anesthesia    a lot of rashes on skin when had C-section    Patient Active Problem List   Diagnosis Date Noted  . Renal mass 08/29/2017  . Mass of left kidney 06/14/2017  . Iron deficiency anemia 06/14/2017  . Leukocytosis 06/14/2017  . Thrombocytosis 06/14/2017  . Nausea vomiting and diarrhea 06/14/2017    Past Surgical History:  Procedure Laterality Date  . CESAREAN SECTION    . ROBOT ASSISTED LAPAROSCOPIC NEPHRECTOMY Left 08/29/2017   Procedure: XI ROBOTIC ASSISTED LAPAROSCOPIC NEPHRECTOMY;  Surgeon: Alexis Frock, MD;  Location: [facility];  Service: Urology;  Laterality: Left;     OB History   No obstetric history on file.     History reviewed. No pertinent family history.  Social History   Tobacco Use  . Smoking status: Never Smoker  . Smokeless tobacco: Never Used  Vaping Use  . Vaping Use: Never used  Substance Use Topics  . Alcohol use: No  . Drug use: No    Home Medications Prior to Admission medications   Medication Sig Start Date End Date Taking? Authorizing Provider  ferrous sulfate 325 (65 FE) MG tablet Take 1 tablet (325 mg total) by mouth daily with breakfast. Patient not taking:  Reported on 08/16/2017 06/17/17   Molt, Bethany, DO  HYDROcodone-acetaminophen (NORCO) 5-325 MG tablet Take 1-2 tablets by mouth every 6 (six) hours as needed for moderate pain or severe pain. 08/29/17   Debbrah Alar, PA-C    Allergies    Patient has no known allergies.  Review of Systems   Review of Systems  Physical Exam Updated Vital Signs BP (!) 124/43   Pulse 90   Temp 100 F (37.8 C) (Oral)   Resp 16   SpO2 100%   Physical Exam Vitals and nursing note reviewed.  Constitutional:      General: She is not in acute distress.    Appearance: She is well-developed.  HENT:     Head: Normocephalic and atraumatic.     Right Ear: External ear normal.     Left Ear: External ear normal.  Eyes:     General: No scleral icterus.       Right eye: No discharge.        Left eye: No discharge.     Conjunctiva/sclera: Conjunctivae normal.  Neck:     Trachea: No tracheal deviation.  Cardiovascular:     Rate and Rhythm: Normal rate and regular rhythm.  Pulmonary:     Effort: Pulmonary effort is normal. No respiratory distress.     Breath sounds: Normal breath sounds. No stridor. No wheezing or rales.  Abdominal:  General: Bowel sounds are normal. There is no distension.     Palpations: Abdomen is soft.     Tenderness: There is no abdominal tenderness. There is no guarding or rebound.  Genitourinary:    Vagina: Bleeding present.     Cervix: Cervical bleeding present.     Uterus: Enlarged.      Adnexa:        Right: No mass.         Left: No mass.       Comments: No clots in the vaginal vault, small amount of blood oozing from the cervical os Musculoskeletal:        General: No tenderness.     Cervical back: Neck supple.  Skin:    General: Skin is warm and dry.     Findings: No rash.  Neurological:     Mental Status: She is alert.     Cranial Nerves: No cranial nerve deficit (no facial droop, extraocular movements intact, no slurred speech).     Sensory: No sensory deficit.       Motor: No abnormal muscle tone or seizure activity.     Coordination: Coordination normal.     ED Results / Procedures / Treatments   Labs (all labs ordered are listed, but only abnormal results are displayed) Labs Reviewed  URINALYSIS, ROUTINE W REFLEX MICROSCOPIC - Abnormal; Notable for the following components:      Result Value   Hgb urine dipstick LARGE (*)    RBC / HPF >50 (*)    Bacteria, UA RARE (*)    Non Squamous Epithelial 0-5 (*)    All other components within normal limits  COMPREHENSIVE METABOLIC PANEL - Abnormal; Notable for the following components:   CO2 21 (*)    Glucose, Bld 113 (*)    Creatinine, Ser 1.01 (*)    Albumin 3.2 (*)    All other components within normal limits  LACTIC ACID, PLASMA - Abnormal; Notable for the following components:   Lactic Acid, Venous 3.3 (*)    All other components within normal limits  RETICULOCYTES - Abnormal; Notable for the following components:   Retic Ct Pct 4.0 (*)    RBC. 2.71 (*)    Immature Retic Fract 29.2 (*)    All other components within normal limits  DIC (DISSEMINATED INTRAVASCULAR COAGULATION) PANEL (NOT AT [facility]) - Abnormal; Notable for the following components:   D-Dimer, Quant 0.85 (*)    All other components within normal limits  CBC - Abnormal; Notable for the following components:   WBC 11.3 (*)    RBC 2.71 (*)    Hemoglobin 3.5 (*)    HCT 15.7 (*)    MCV 57.9 (*)    MCH 12.9 (*)    MCHC 22.3 (*)    RDW 21.5 (*)    Platelets 601 (*)    nRBC 1.7 (*)    All other components within normal limits  I-STAT CHEM 8, ED - Abnormal; Notable for the following components:   Glucose, Bld 108 (*)    TCO2 21 (*)    Hemoglobin 5.1 (*)    HCT 15.0 (*)    All other components within normal limits  RESPIRATORY PANEL BY RT PCR (FLU A&B, COVID)  CULTURE, BLOOD (ROUTINE X 2)  CULTURE, BLOOD (ROUTINE X 2)  PROTIME-INR  LACTIC ACID, PLASMA  VITAMIN B12  FOLATE  IRON AND TIBC  FERRITIN  CBG MONITORING, ED   I-STAT BETA HCG BLOOD, ED ([facility], [facility],  [facility] ONLY)  TYPE AND SCREEN  PREPARE RBC (CROSSMATCH)    EKG None  Radiology DG Chest Portable 1 View  Result Date: 05/01/2020 CLINICAL DATA:  Abdominal pain with dizziness, headache and nausea 3 months. Nephrectomy 2 years ago for kidney cancer. EXAM: PORTABLE CHEST 1 VIEW COMPARISON:  06/15/2017 FINDINGS: Lungs are adequately inflated without focal airspace consolidation or effusion. Borderline stable cardiomegaly. Remainder of the exam is unchanged. IMPRESSION: No active disease. Electronically Signed   By: Marin Olp M.D.   On: 05/01/2020 15:29    Procedures .Critical Care Performed by: Dorie Rank, MD Authorized by: Dorie Rank, MD   Critical care provider statement:    Critical care time (minutes):  45   Critical care was time spent personally by me on the following activities:  Discussions with consultants, evaluation of patient's response to treatment, examination of patient, ordering and performing treatments and interventions, ordering and review of laboratory studies, ordering and review of radiographic studies, pulse oximetry, re-evaluation of patient's condition, obtaining history from patient or surrogate and review of old charts   (including critical care time)  Medications Ordered in ED Medications  0.9 %  sodium chloride infusion (has no administration in time range)    ED Course  I have reviewed the triage vital signs and the nursing notes.  Pertinent labs & imaging results that were available during my care of the patient were reviewed by me and considered in my medical decision making (see chart for details).  Clinical Course as of May 01 1818  Sat May 01, 2020  1709 CBC shows hemoglobin of 3.5.  Platelet count is elevated at 601.   [JK]  1710 Covid panel is negative.   [JK]  1710 Lactic acid level is elevated but I do not think this is related to infection or sepsis.  I suspect this is related to her profound anemia    [JK]  1742 Checked on blood.  Waiting for it to come up from blood bank.  Should be starting soon.   [JK]    Clinical Course User Index [JK] Dorie Rank, MD   MDM Rules/Calculators/A&P                          Patient presented to the ED with complaints of generalized weakness dizziness.  Vital signs were normal.  Patient had temperature of 37.8.  Sepsis considered initially but patient was also very pale and appeared to be anemic.  Laboratory test confirmed profound anemia.  Patient has a hemoglobin of 3.5.  She has an MCV at 57.9 suggesting an acute on chronic iron deficiency anemia.  Patient has been having very heavy irregular menstrual bleeding.  This appears to be the source of her blood loss anemia.  Patient is not pregnant.  Covid and flu tests are negative.  Iron studies have been sent off.  No signs of DIC or sepsis.  Pelvic exam does not show any active heavy bleeding at this time.  5 ordered blood transfusions.  I will consult GYN service.  Plan on admission to the hospital for further treatment.   Final Clinical Impression(s) / ED Diagnoses Final diagnoses:  Blood loss anemia  Abnormal vaginal bleeding      Dorie Rank, MD 05/02/20 1501

## 2020-05-01 NOTE — ED Notes (Signed)
Pt's husband at bedside.

## 2020-05-01 NOTE — ED Notes (Signed)
Urine sent to lab WITH culture. 

## 2020-05-01 NOTE — ED Notes (Signed)
Consent for blood signed by pt

## 2020-05-01 NOTE — ED Notes (Signed)
Pt critical lab results read back to Dr. Ron Parker

## 2020-05-01 NOTE — ED Triage Notes (Addendum)
Patient arrives to ED with complaints of dizziness, headache, and nausea x3 months. Pt states these symptoms come and go throughout the week. Hx of kidney CA and had nephrectomy x2 years ago. Pt has recently noted yellow skin color change. Pt states she is also experiencing nose bleeds and vaginal bleeding frequently that last month. No CP or SOB. Came in today due to feeling of fever and chills.

## 2020-05-02 DIAGNOSIS — D649 Anemia, unspecified: Secondary | ICD-10-CM

## 2020-05-02 DIAGNOSIS — D500 Iron deficiency anemia secondary to blood loss (chronic): Principal | ICD-10-CM

## 2020-05-02 DIAGNOSIS — D259 Leiomyoma of uterus, unspecified: Secondary | ICD-10-CM

## 2020-05-02 LAB — CBC
HCT: 21.5 % — ABNORMAL LOW (ref 36.0–46.0)
HCT: 25 % — ABNORMAL LOW (ref 36.0–46.0)
Hemoglobin: 6 g/dL — CL (ref 12.0–15.0)
Hemoglobin: 7.6 g/dL — ABNORMAL LOW (ref 12.0–15.0)
MCH: 20 pg — ABNORMAL LOW (ref 26.0–34.0)
MCH: 20.9 pg — ABNORMAL LOW (ref 26.0–34.0)
MCHC: 27.9 g/dL — ABNORMAL LOW (ref 30.0–36.0)
MCHC: 30.4 g/dL (ref 30.0–36.0)
MCV: 71.7 fL — ABNORMAL LOW (ref 80.0–100.0)
MCV: 68.9 fL — ABNORMAL LOW (ref 80.0–100.0)
Platelets: 386 10*3/uL (ref 150–400)
Platelets: 395 10*3/uL (ref 150–400)
RBC: 3 MIL/uL — ABNORMAL LOW (ref 3.87–5.11)
RBC: 3.63 MIL/uL — ABNORMAL LOW (ref 3.87–5.11)
RDW: 30.5 % — ABNORMAL HIGH (ref 11.5–15.5)
WBC: 8.2 10*3/uL (ref 4.0–10.5)
WBC: 11.1 10*3/uL — ABNORMAL HIGH (ref 4.0–10.5)
nRBC: 2 % — ABNORMAL HIGH (ref 0.0–0.2)
nRBC: 2.2 % — ABNORMAL HIGH (ref 0.0–0.2)

## 2020-05-02 LAB — PREPARE RBC (CROSSMATCH)

## 2020-05-02 MED ORDER — DOCUSATE SODIUM 100 MG PO CAPS: 100.0000 mg | ORAL_CAPSULE | Freq: Two times a day (BID) | ORAL | 2 refills | Status: DC | PRN

## 2020-05-02 MED ORDER — STERILE WATER FOR INJECTION IJ SOLN
INTRAMUSCULAR | Status: AC
  Filled 2020-05-02: qty 10

## 2020-05-02 MED ORDER — MEGESTROL ACETATE 40 MG PO TABS
40.0000 mg | ORAL_TABLET | Freq: Two times a day (BID) | ORAL | 2 refills | Status: DC
Start: 2020-05-02 — End: 2020-05-31

## 2020-05-02 MED ORDER — SALINE SPRAY 0.65 % NA SOLN
1.0000 | NASAL | Status: DC | PRN
  Administered 2020-05-02: 1 via NASAL
  Filled 2020-05-02: qty 44

## 2020-05-02 MED ORDER — FERROUS SULFATE 325 (65 FE) MG PO TABS: 325.0000 mg | ORAL_TABLET | ORAL | 1 refills | Status: DC

## 2020-05-02 MED ORDER — SODIUM CHLORIDE 0.9% IV SOLUTION: Freq: Once | INTRAVENOUS | Status: DC

## 2020-05-02 MED ORDER — GADOBUTROL 1 MMOL/ML IV SOLN
9.0000 mL | Freq: Once | INTRAVENOUS | Status: AC | PRN
  Administered 2020-05-02: 9 mL via INTRAVENOUS

## 2020-05-02 NOTE — Discharge Summary (Signed)
Physician Discharge Summary  Patient ID: Deborah Higgins MRN: [id number] DOB/AGE: [birth year] 46 y.o.  Admit date: 05/01/2020 Discharge date: 05/02/2020  Admission Diagnoses: Anemia, IDA and chronic blood loss  Discharge Diagnoses:  Active Problems:   Anemia   Uterine fibroids  Discharged Condition: good  Hospital Course: Deborah Higgins was admitted with above Dx. She received 4 units of PRBC's with improvement of her Hgb to 7.6. She also received IV TXA x1, IV premarin x 4 doses and was started on Megace. Her bleeding responded well to these measures. She was tolerating diet and ambulating to the rest room without problems MRI of pelvis was consistent with uterine fibroids She was felt to be amendable for discharge home with follow up in the off ice for additional evaluation. Discharge instructions, medications and follow up were reviewed with her using a video interrupter.   Consults: None  Significant Diagnostic Studies: labs and MRI  Treatments: IV hydration and blood transfusion  Discharge Exam: Blood pressure (!) 148/75, pulse 80, temperature 98.2 F (36.8 C), temperature source Oral, resp. rate 18, height 5\' 6"  (1.676 m), weight 90.7 kg, SpO2 100 %.  Lugs clear Heart RRR Abd soft + BS obese GU deferred Ext non tender  Disposition: Discharge disposition: 01-Home or Self Care       Discharge Instructions    Call MD for:  difficulty breathing, headache or visual disturbances   Complete by: As directed    Call MD for:  extreme fatigue   Complete by: As directed    Call MD for:  hives   Complete by: As directed    Call MD for:  persistant dizziness or light-headedness   Complete by: As directed    Call MD for:  persistant nausea and vomiting   Complete by: As directed    Call MD for:  redness, tenderness, or signs of infection (pain, swelling, redness, odor or green/yellow discharge around incision site)   Complete by: As directed    Call MD for:   severe uncontrolled pain   Complete by: As directed    Call MD for:  temperature >100.4   Complete by: As directed    Diet - low sodium heart healthy   Complete by: As directed    Increase activity slowly   Complete by: As directed    Sexual Activity Restrictions   Complete by: As directed    Pelvic rest x 4 weeks     Allergies as of 05/02/2020   No Known Allergies     Medication List    TAKE these medications   acetaminophen 325 MG tablet Commonly known as: TYLENOL Take 650 mg by mouth every 6 (six) hours as needed for mild pain, fever or headache.   docusate sodium 100 MG capsule Commonly known as: COLACE Take 1 capsule (100 mg total) by mouth 2 (two) times daily as needed.   ferrous sulfate 325 (65 FE) MG tablet Commonly known as: FerrouSul Take 1 tablet (325 mg total) by mouth every other day.   megestrol 40 MG tablet Commonly known as: MEGACE Take 1 tablet (40 mg total) by mouth 2 (two) times daily.       Follow-up [place] [facility] at [facility]. Schedule an appointment as soon as possible for a visit in 2 week(s).   Specialty: Obstetrics and Gynecology Why: Hospital follow with MD provider Contact information: [place] [id number] [phone]  SignedChancy Milroy 05/02/2020, 4:43 PM

## 2020-05-02 NOTE — H&P (Signed)
Deborah Higgins is an 44 y.o. female [phone] who presented to [facility] with fatigue. W/U in ER noted severe anemia. Pt reports vaginal bleeding to some degree daily for the last 4 months. H/O of irregular and heavy cycles. H/O of uterine fibroids, by CT scan 2019.    Does not have a PCP and has not seen a doctor for this problem.  Unknown last pap smear. Sexual active without contraception.  H/O Left nephrectomy 3/19 for renal cell carcinoma, negative margins H/O c section  Denies chronic medical problems but does not have a PCP  TSVD x 6 C section x 1   Menstrual History: Menarche age: 51 No LMP recorded.    Past Medical History:  Diagnosis Date  . Complication of anesthesia    a lot of rashes on skin when had C-section    Past Surgical History:  Procedure Laterality Date  . CESAREAN SECTION    . ROBOT ASSISTED LAPAROSCOPIC NEPHRECTOMY Left 08/29/2017   Procedure: XI ROBOTIC ASSISTED LAPAROSCOPIC NEPHRECTOMY;  Surgeon: Alexis Frock, MD;  Location: [facility];  Service: Urology;  Laterality: Left;    History reviewed. No pertinent family history.  Social History:  reports that she has never smoked. She has never used smokeless tobacco. She reports that she does not drink alcohol and does not use drugs.  Allergies: No Known Allergies  Medications Prior to Admission  Medication Sig Dispense Refill Last Dose  . ferrous sulfate 325 (65 FE) MG tablet Take 1 tablet (325 mg total) by mouth daily with breakfast. (Patient not taking: Reported on 08/16/2017) 30 tablet 0   . HYDROcodone-acetaminophen (NORCO) 5-325 MG tablet Take 1-2 tablets by mouth every 6 (six) hours as needed for moderate pain or severe pain. 30 tablet 0     Review of Systems  Constitutional: Positive for fatigue.  Respiratory: Positive for shortness of breath.   Cardiovascular: Negative.   Genitourinary: Positive for menstrual problem and vaginal bleeding.    Blood pressure 129/73, pulse 73, temperature  98.4 F (36.9 C), temperature source Oral, resp. rate 18, height 5\' 6"  (1.676 m), weight 90.7 kg, SpO2 100 %. Physical Exam Constitutional:      Appearance: Normal appearance.  Cardiovascular:     Rate and Rhythm: Normal rate and regular rhythm.  Pulmonary:     Effort: Pulmonary effort is normal.     Breath sounds: Normal breath sounds.  Abdominal:     General: Bowel sounds are normal.     Palpations: Abdomen is soft.  Genitourinary:    Comments: deffered Neurological:     Mental Status: She is alert.     Results for orders placed or performed during the hospital encounter of 05/01/20 (from the past 24 hour(s))  Comprehensive metabolic panel     Status: Abnormal   Collection Time: 05/01/20  2:15 PM  Result Value Ref Range   Sodium 137 135 - 145 mmol/L   Potassium 4.0 3.5 - 5.1 mmol/L   Chloride 105 98 - 111 mmol/L   CO2 21 (L) 22 - 32 mmol/L   Glucose, Bld 113 (H) 70 - 99 mg/dL   BUN 15 6 - 20 mg/dL   Creatinine, Ser 1.01 (H) 0.44 - 1.00 mg/dL   Calcium 9.0 8.9 - 10.3 mg/dL   Total Protein 7.1 6.5 - 8.1 g/dL   Albumin 3.2 (L) 3.5 - 5.0 g/dL   AST 16 15 - 41 U/L   ALT 14 0 - 44 U/L   Alkaline Phosphatase 66 38 -  126 U/L   Total Bilirubin 0.5 0.3 - 1.2 mg/dL   GFR, Estimated >60 >60 mL/min   Anion gap 11 5 - 15  I-Stat beta hCG blood, ED     Status: None   Collection Time: 05/01/20  2:44 PM  Result Value Ref Range   I-stat hCG, quantitative <5.0 <5 mIU/mL   Comment 3          I-stat chem 8, ED (not at [facility] or [facility])     Status: Abnormal   Collection Time: 05/01/20  2:58 PM  Result Value Ref Range   Sodium 139 135 - 145 mmol/L   Potassium 4.0 3.5 - 5.1 mmol/L   Chloride 105 98 - 111 mmol/L   BUN 15 6 - 20 mg/dL   Creatinine, Ser 0.90 0.44 - 1.00 mg/dL   Glucose, Bld 108 (H) 70 - 99 mg/dL   Calcium, Ion 1.24 1.15 - 1.40 mmol/L   TCO2 21 (L) 22 - 32 mmol/L   Hemoglobin 5.1 (LL) 12.0 - 15.0 g/dL   HCT 15.0 (L) 36 - 46 %   Comment NOTIFIED PHYSICIAN   Lactic acid,  plasma     Status: Abnormal   Collection Time: 05/01/20  3:09 PM  Result Value Ref Range   Lactic Acid, Venous 3.3 (HH) 0.5 - 1.9 mmol/L  Protime-INR     Status: None   Collection Time: 05/01/20  3:10 PM  Result Value Ref Range   Prothrombin Time 14.0 11.4 - 15.2 seconds   INR 1.1 0.8 - 1.2  Reticulocytes     Status: Abnormal   Collection Time: 05/01/20  3:10 PM  Result Value Ref Range   Retic Ct Pct 4.0 (H) 0.4 - 3.1 %   RBC. 2.71 (L) 3.87 - 5.11 MIL/uL   Retic Count, Absolute 107.9 19.0 - 186.0 K/uL   Immature Retic Fract 29.2 (H) 2.3 - 15.9 %  CBC     Status: Abnormal   Collection Time: 05/01/20  3:10 PM  Result Value Ref Range   WBC 11.3 (H) 4.0 - 10.5 K/uL   RBC 2.71 (L) 3.87 - 5.11 MIL/uL   Hemoglobin 3.5 (LL) 12.0 - 15.0 g/dL   HCT 15.7 (L) 36 - 46 %   MCV 57.9 (L) 80.0 - 100.0 fL   MCH 12.9 (L) 26.0 - 34.0 pg   MCHC 22.3 (L) 30.0 - 36.0 g/dL   RDW 21.5 (H) 11.5 - 15.5 %   Platelets 601 (H) 150 - 400 K/uL   nRBC 1.7 (H) 0.0 - 0.2 %  Urinalysis, Routine w reflex microscopic Urine, Clean Catch     Status: Abnormal   Collection Time: 05/01/20  3:14 PM  Result Value Ref Range   Color, Urine YELLOW YELLOW   APPearance CLEAR CLEAR   Specific Gravity, Urine 1.014 1.005 - 1.030   pH 5.0 5.0 - 8.0   Glucose, UA NEGATIVE NEGATIVE mg/dL   Hgb urine dipstick LARGE (A) NEGATIVE   Bilirubin Urine NEGATIVE NEGATIVE   Ketones, ur NEGATIVE NEGATIVE mg/dL   Protein, ur NEGATIVE NEGATIVE mg/dL   Nitrite NEGATIVE NEGATIVE   Leukocytes,Ua NEGATIVE NEGATIVE   RBC / HPF >50 (H) 0 - 5 RBC/hpf   WBC, UA 0-5 0 - 5 WBC/hpf   Bacteria, UA RARE (A) NONE SEEN   Squamous Epithelial / LPF 0-5 0 - 5   Mucus PRESENT    Hyaline Casts, UA PRESENT    Non Squamous Epithelial 0-5 (A) NONE SEEN  Prepare RBC (  crossmatch)     Status: None   Collection Time: 05/01/20  3:18 PM  Result Value Ref Range   Order Confirmation      ORDER PROCESSED BY BLOOD BANK Performed at [facility], [place] [address] [place], [place] [address]   Type and screen [place]     Status: None (Preliminary result)   Collection Time: 05/01/20  3:20 PM  Result Value Ref Range   ABO/RH(D) O POS    Antibody Screen NEG    Sample Expiration 05/04/2020,2359    Unit Number [id number]    Blood Component Type RED CELLS,LR    Unit division 00    Status of Unit ISSUED    Transfusion Status OK TO TRANSFUSE    Crossmatch Result Compatible    Unit Number [id number]    Blood Component Type RED CELLS,LR    Unit division 00    Status of Unit ISSUED    Transfusion Status OK TO TRANSFUSE    Crossmatch Result Compatible    Unit Number [id number]    Blood Component Type RED CELLS,LR    Unit division 00    Status of Unit ISSUED    Transfusion Status OK TO TRANSFUSE    Crossmatch Result      Compatible Performed at [facility], [place] [address], [place], [place] [address]    Unit Number [id number]    Blood Component Type RED CELLS,LR    Unit division 00    Status of Unit ALLOCATED    Transfusion Status OK TO TRANSFUSE    Crossmatch Result Compatible   Respiratory Panel by RT PCR (Flu A&B, Covid) - Nasopharyngeal Swab     Status: None   Collection Time: 05/01/20  3:29 PM   Specimen: Nasopharyngeal Swab; Nasopharyngeal(NP) swabs in vial transport medium  Result Value Ref Range   SARS Coronavirus 2 by RT PCR NEGATIVE NEGATIVE   Influenza A by PCR NEGATIVE NEGATIVE   Influenza B by PCR NEGATIVE NEGATIVE  Lactic acid, plasma     Status: None   Collection Time: 05/01/20  5:32 PM  Result Value Ref Range   Lactic Acid, Venous 1.2 0.5 - 1.9 mmol/L  Vitamin B12     Status: None   Collection Time: 05/01/20  5:32 PM  Result Value Ref Range   Vitamin B-12 269 180 - 914 pg/mL  Folate     Status: None   Collection Time: 05/01/20  5:32 PM  Result Value Ref Range   Folate 21.6 >5.9 ng/mL  Iron and TIBC     Status: Abnormal   Collection Time: 05/01/20  5:32 PM  Result Value Ref  Range   Iron 8 (L) 28 - 170 ug/dL   TIBC 553 (H) 250 - 450 ug/dL   Saturation Ratios 1 (L) 10.4 - 31.8 %   UIBC 545 ug/dL  Ferritin     Status: Abnormal   Collection Time: 05/01/20  5:32 PM  Result Value Ref Range   Ferritin 1 (L) 11 - 307 ng/mL  DIC Panel (Not at [facility]) ONCE - STAT     Status: Abnormal   Collection Time: 05/01/20  5:32 PM  Result Value Ref Range   Prothrombin Time 14.7 11.4 - 15.2 seconds   INR 1.2 0.8 - 1.2   aPTT 28 24 - 36 seconds   Fibrinogen 262 210 - 475 mg/dL   D-Dimer, Quant 0.85 (H) 0.00 - 0.50 ug/mL-FEU   Platelets 466 (H) 150 -  400 K/uL   Smear Review NO SCHISTOCYTES SEEN   Prepare RBC (crossmatch)     Status: None   Collection Time: 05/02/20  1:05 AM  Result Value Ref Range   Order Confirmation      ORDER PROCESSED BY BLOOD BANK Performed at [facility], [address] [place], [place] [address]   CBC     Status: Abnormal   Collection Time: 05/02/20  7:41 AM  Result Value Ref Range   WBC 8.2 4.0 - 10.5 K/uL   RBC 3.00 (L) 3.87 - 5.11 MIL/uL   Hemoglobin 6.0 (LL) 12.0 - 15.0 g/dL   HCT 21.5 (L) 36 - 46 %   MCV 71.7 (L) 80.0 - 100.0 fL   MCH 20.0 (L) 26.0 - 34.0 pg   MCHC 27.9 (L) 30.0 - 36.0 g/dL   RDW 30.5 (H) 11.5 - 15.5 %   Platelets 386 150 - 400 K/uL   nRBC 2.0 (H) 0.0 - 0.2 %    DG Chest Portable 1 View  Result Date: 05/01/2020 CLINICAL DATA:  Abdominal pain with dizziness, headache and nausea 3 months. Nephrectomy 2 years ago for kidney cancer. EXAM: PORTABLE CHEST 1 VIEW COMPARISON:  06/15/2017 FINDINGS: Lungs are adequately inflated without focal airspace consolidation or effusion. Borderline stable cardiomegaly. Remainder of the exam is unchanged. IMPRESSION: No active disease. Electronically Signed   By: Marin Olp M.D.   On: 05/01/2020 15:29    Assessment/Plan: Anemia, chronic blood loss H/O uterine fibroids H/O left nephrectomy  Pt will be admitted for IV Premarin, start oral Megace as well. Blood transfusion x 4  units. Check pelvic MRI.  Video interrupter used for H & P.    Chancy Milroy 05/02/2020, 9:19 AM

## 2020-05-02 NOTE — Discharge Instructions (Signed)
Anemia Anemia  La anemia es una afeccin en la cual no hay suficientes glbulos rojos o hemoglobina. La hemoglobina es la sustancia de los glbulos rojos que lleva el oxgeno. Cuando no hay suficientes glbulos rojos o hemoglobina (est anmico), su cuerpo no puede recibir el oxgeno suficiente, y es posible que sus rganos no funcionen correctamente. Como [place], es posible que se sienta muy cansado o sufra otros problemas. Cules son las causas? Las causas ms frecuentes de anemia son:  Deborah Higgins. La anemia puede ser causada por un sangrado excesivo dentro o fuera del cuerpo, incluido el sangrado del intestino o del perodo en las mujeres.  Dficit nutricional.  Enfermedad heptica, tiroidea o renal (crnicas).  Trastornos de la mdula sea.  Cncer y tratamientos para Science writer.  VIH (virus de inmunodeficiencia [place]) y SIDA (sndrome de inmunodeficiencia adquirida).  Tratamientos para el VIH y [place].  Problemas en el bazo.  Enfermedades de [employer].  Infecciones, medicamentos y enfermedades autoinmunes que [employer] glbulos rojos. Cules son los signos o los sntomas? Los sntomas de esta afeccin incluyen los siguientes:  Debilidad leve.  Mareos.  Dolor de [place].  Sensacin de latidos cardacos irregulares o ms rpidos que lo normal (palpitaciones).  Falta de aire, especialmente con el ejercicio.  Palidez.  Sensibilidad al fro.  Dispepsia.  Nuseas.  Dificultad para dormir.  Dificultad para concentrarse. Los sntomas pueden ocurrir repentinamente o Administrator, Civil Service. Si la anemia es leve, es posible que no tenga sntomas. Cmo se diagnostica? Esta afeccin se diagnostica en funcin de lo siguiente:  Anlisis de sangre.  Sus antecedentes mdicos.  Un examen fsico.  Biopsia de mdula sea. Adems, el mdico puede controlar si hay sangre en sus heces (materia fecal) y Optometrist anlisis adicionales para Hydrographic surveyor la causa  del sangrado. Tambin pueden hacerle otros estudios, por ejemplo:  Pruebas de diagnstico por imgenes, como una resonancia magntica (RM) o una exploracin por tomografa computarizada (TC).  Endoscopia.  Colonoscopia. Cmo se trata? El tratamiento de esta afeccin depende de la causa. Si contina perdiendo [employer], es posible que necesite recibir tratamiento en un hospital. El tratamiento puede incluir lo siguiente:  Tomar suplementos de hierro, vitamina W29 o cido flico.  Tomar un medicamento para las hormonas (eritropoyetina) que puede ayudar a Engineer, maintenance (IT) de glbulos rojos.  Recibir una transfusin de [place]. Esta ser necesaria si pierde [employer].  Realizar cambios en la dieta.  Someterse a [place] para Nurse, mental health. Siga estas indicaciones en su casa:  Tome los medicamentos de venta libre y los recetados solamente como se lo haya indicado el mdico.  Tome los suplementos solamente como se lo haya indicado el mdico.  Siga las instrucciones de la dieta que le hayan dado.  Concurra a todas las visitas de seguimiento como se lo haya indicado el mdico. Esto es importante. Comunquese con un mdico si:  Tiene nuevos sangrados en cualquier parte del cuerpo. Solicite ayuda de inmediato si:  Se siente muy dbil.  Le falta el aire.  Siente dolor en la espalda, el abdomen o el pecho.  Se siente mareado o sufre un desmayo.  Tiene dificultad para concentrarse.  Las heces son alquitranadas, sanguinolentas o negras.  Vomita repetidamente o vomita sangre. Resumen  La anemia es una afeccin en la que no hay suficientes glbulos rojos o la cantidad suficiente de la sustancia de los glbulos rojos que transporta el oxgeno (hemoglobina).  Los sntomas pueden ocurrir repentinamente o Administrator, Civil Service.  Si  la anemia es leve, es posible que no tenga sntomas.  Esta afeccin se diagnostica mediante anlisis de sangre y un examen fsico,  y en funcin de sus antecedentes mdicos. Pueden ser necesarios otros estudios.  El tratamiento de esta afeccin depende de la causa de la anemia. Esta informacin no tiene Marine scientist el consejo del mdico. Asegrese de hacerle al mdico cualquier pregunta que tenga. Document Revised: 09/18/2016 Document Reviewed: 09/18/2016 Elsevier Patient Education  2020 [place] uterino anormal Abnormal Uterine Bleeding El sangrado uterino anormal sucede cuando el sangrado del tero es ms duradero o ms abundante que lo normal. Esto puede incluir lo siguiente:  Hemorragias entre los perodos [place].  Sangrado luego de [employer].  Sangrado ms abundante que lo normal.  Perodos que duran ms de lo normal.  Sangrado durante la menopausia. Esta afeccin puede ser causada por muchos problemas. Si tiene un sangrado que no considera normal, debe consultar al [employer]. El tratamiento depende de la causa del sangrado. Siga estas indicaciones en su casa:  Controle su afeccin para detectar cualquier cambio.  No use tampones, no se haga duchas vaginales ni mantenga relaciones sexuales si su mdico le dice que no lo haga.  Cambie los apsitos con frecuencia.  Realcese los exmenes de rutina para mujeres. Asegrese de hacerse un examen plvico y de control de cncer de cuello uterino.  Concurra a todas las visitas de control como se lo haya indicado el mdico. Esto es importante. Comunquese con un mdico si:  El sangrado dura ms de una semana.  Se siente mareada por momentos.  Siente que va a vomitar (nuseas).  Vomita. Solicite ayuda de inmediato si:  Se desmaya.  Debe cambiarse los apsitos cada hora.  Siente dolor de estmago (abdominal).  Tiene fiebre.  Philbert Riser.  Se debilita.  Elimina cogulos de sangre grandes por la vagina. Resumen  El sangrado uterino anormal sucede cuando el sangrado del tero es ms duradero o ms  abundante que lo normal.  Esta afeccin puede ser causada por muchos problemas. Si tiene un sangrado que no considera normal, debe consultar al [employer].  El tratamiento depende de la causa del sangrado. Esta informacin no tiene Marine scientist el consejo del mdico. Asegrese de hacerle al mdico cualquier pregunta que tenga. Document Revised: 04/03/2017 Document Reviewed: 11/21/2016 Elsevier Patient Education  [place].

## 2020-05-02 NOTE — Progress Notes (Signed)
Just an FYI.  Patient received 1 of 4 PRBC's while in the ED.  Patient is currently getting 2 of 4 PRBC's.    Patient will need 2 more units.  Blood bank reported that they need another order for the next two units to prepare.  Called [phone] to get message to on-call MD for order.  Awaiting call back:  MD at [phone], gave order to transfuse 2 more units PRBC's

## 2020-05-02 NOTE — Progress Notes (Signed)
Third unit of blood is currently going.  Day staff to continue to monitor.

## 2020-05-02 NOTE — Progress Notes (Signed)
Deborah Higgins to be D/C'd  per MD order. Discussed with the patient and all questions fully answered.  VSS, Skin clean, dry and intact without evidence of skin break down, no evidence of skin tears noted.  IV catheter discontinued intact. Site without signs and symptoms of complications. Dressing and pressure applied.  An After Visit Summary was printed and given to the patient. Patient received prescription.  D/c education completed with patient/family including follow up instructions, medication list, d/c activities limitations if indicated, with other d/c instructions as indicated by MD - patient able to verbalize understanding, all questions fully answered.   Patient instructed to return to ED, call 911, or call MD for any changes in condition.   Patient to be escorted via [place], and D/C home via private auto.

## 2020-05-03 ENCOUNTER — Telehealth: Payer: Self-pay

## 2020-05-03 LAB — BPAM RBC
Blood Product Expiration Date: 202112132359
Blood Product Expiration Date: 202112142359
Blood Product Expiration Date: 202112242359
Blood Product Expiration Date: 202112242359
ISSUE DATE / TIME: 202111201800
ISSUE DATE / TIME: 202111202357
ISSUE DATE / TIME: 202111210446
ISSUE DATE / TIME: 202111211036
Unit Type and Rh: 5100
Unit Type and Rh: 5100
Unit Type and Rh: 5100
Unit Type and Rh: 5100

## 2020-05-03 LAB — TYPE AND SCREEN
ABO/RH(D): O POS
Antibody Screen: NEGATIVE
Unit division: 0
Unit division: 0
Unit division: 0
Unit division: 0

## 2020-05-03 NOTE — Telephone Encounter (Signed)
Transition Care Management Unsuccessful Follow-up Telephone Call  Date of discharge and from where:  05/02/2020, [facility]   Attempts:  1st Attempt  Reason for unsuccessful TCM follow-up call:  Unable to leave message - call placed to patient # [phone] with assistance of Spanish interpreter # [phone]/[employer].  Voicemail was not set up, unable to leave a message.   Need to discuss scheduling hospital follow up appointment Dr Margarita Rana is listed as PCP but patient has not been seen at [facility] in almost 3 years.

## 2020-05-04 ENCOUNTER — Telehealth: Payer: Self-pay

## 2020-05-04 NOTE — Telephone Encounter (Signed)
Transition Care Management Unsuccessful Follow-up Telephone Call  Date of discharge and from where:  05/02/2020, [facility]  Attempts:  2nd Attempt  Reason for unsuccessful TCM follow-up call:  Unable to leave message - call placed to patient # [phone] with assistance of Spanish interpreter # [phone]/[employer]. Voicemail not set up .  Letter sent to patient, requesting she contact this office to schedule a follow up appointment.

## 2020-05-06 LAB — CULTURE, BLOOD (ROUTINE X 2)
Culture: NO GROWTH
Culture: NO GROWTH
Special Requests: ADEQUATE
Special Requests: ADEQUATE

## 2020-05-20 ENCOUNTER — Ambulatory Visit: Payer: Self-pay | Admitting: Obstetrics & Gynecology

## 2020-05-31 ENCOUNTER — Other Ambulatory Visit: Payer: Self-pay

## 2020-05-31 ENCOUNTER — Encounter: Payer: Self-pay | Admitting: Obstetrics & Gynecology

## 2020-05-31 ENCOUNTER — Other Ambulatory Visit (HOSPITAL_COMMUNITY)
Admission: RE | Admit: 2020-05-31 | Discharge: 2020-05-31 | Disposition: A | Discharge status: Home / Self Care | Payer: Self-pay | Source: Ambulatory Visit | Attending: Obstetrics & Gynecology | Admitting: Obstetrics & Gynecology

## 2020-05-31 ENCOUNTER — Ambulatory Visit (INDEPENDENT_AMBULATORY_CARE_PROVIDER_SITE_OTHER): Payer: Self-pay | Admitting: Obstetrics & Gynecology

## 2020-05-31 VITALS — BP 131/72 | HR 77 | Wt 234.9 lb

## 2020-05-31 DIAGNOSIS — N921 Excessive and frequent menstruation with irregular cycle: Secondary | ICD-10-CM | POA: Insufficient documentation

## 2020-05-31 DIAGNOSIS — D500 Iron deficiency anemia secondary to blood loss (chronic): Secondary | ICD-10-CM

## 2020-05-31 DIAGNOSIS — Z3202 Encounter for pregnancy test, result negative: Secondary | ICD-10-CM

## 2020-05-31 LAB — POCT PREGNANCY, URINE: Preg Test, Ur: NEGATIVE

## 2020-05-31 MED ORDER — MEGESTROL ACETATE 40 MG PO TABS
40.0000 mg | ORAL_TABLET | Freq: Two times a day (BID) | ORAL | 2 refills | Status: DC
Start: 2020-05-31 — End: 2022-03-28

## 2020-05-31 NOTE — Progress Notes (Signed)
Deborah Higgins is an 44 y.o.  Female who presents for menorrhagia, pt has had heavy bleeding and passage of clots for past 4-6 months, was seen in ED with severe anemia, transfused. She was started on Megace at that time, her bleeding has improved since then.   Pertinent Gynecological History: Menses: flow is moderate Bleeding: intermenstrual bleeding and dysfunctional uterine bleeding Contraception: none Blood transfusions: on 11/21 Sexually transmitted diseases: no past history Previous GYN Procedures: none  Last mammogram: no recent imaging  Last pap: normal Date: 2018 OB History: G8, P7017   Menstrual History: Menarche age: 30 No LMP recorded (approximate). (Menstrual status: Irregular Periods).    Past Medical History:  Diagnosis Date  . Complication of anesthesia    a lot of rashes on skin when had C-section    Past Surgical History:  Procedure Laterality Date  . CESAREAN SECTION  2007  . ROBOT ASSISTED LAPAROSCOPIC NEPHRECTOMY Left 08/29/2017   Procedure: XI ROBOTIC ASSISTED LAPAROSCOPIC NEPHRECTOMY;  Surgeon: Alexis Frock, MD;  Location: [facility];  Service: Urology;  Laterality: Left;    No family history on file.  Social History:  reports that she has never smoked. She has never used smokeless tobacco. She reports that she does not drink alcohol and does not use drugs.  Allergies: No Known Allergies  (Not in a hospital admission)   Review of Systems  Constitutional: Negative for activity change, appetite change, fatigue and unexpected weight change.  HENT: Negative.   Eyes: Negative.   Respiratory: Negative.  Negative for chest tightness, shortness of breath and wheezing.   Cardiovascular: Negative.  Negative for chest pain and leg swelling.  Gastrointestinal: Negative.  Negative for abdominal distention, abdominal pain, constipation, diarrhea, nausea and vomiting.  Endocrine: Negative.   Genitourinary: Positive for menstrual problem, pelvic  pain and vaginal bleeding. Negative for dysuria and hematuria.  Neurological: Negative.  Negative for dizziness, weakness, light-headedness, numbness and headaches.  Hematological: Negative.   Psychiatric/Behavioral: Negative.  Negative for agitation, confusion and decreased concentration.    Weight 234 lb 14.4 oz (106.5 kg). Physical Exam Vitals reviewed.  Constitutional:      Appearance: She is well-developed and well-nourished.  HENT:     Head: Normocephalic and atraumatic.  Eyes:     Pupils: Pupils are equal, round, and reactive to light.  Cardiovascular:     Rate and Rhythm: Normal rate.  Pulmonary:     Effort: Pulmonary effort is normal.  Abdominal:     General: There is no distension.     Palpations: Abdomen is soft.     Tenderness: There is no abdominal tenderness. There is no guarding.  Genitourinary:    General: Normal vulva.     Vagina: No vaginal discharge.  Musculoskeletal:        General: No edema.     Cervical back: Normal range of motion.  Skin:    General: Skin is warm and dry.  Neurological:     Mental Status: She is alert and oriented to person, place, and time.  Psychiatric:        Mood and Affect: Mood and affect normal.        Behavior: Behavior normal.    ENDOMETRIAL BIOPSY     The indications for endometrial biopsy were reviewed.   Risks of the biopsy including cramping, bleeding, infection, uterine perforation, inadequate specimen and need for additional procedures  were discussed. The patient states she understands and agrees to undergo procedure today. Consent was signed. Time out  was performed. Urine HCG was negative. During the pelvic exam, the cervix was prepped with Betadine. A single-toothed tenaculum was placed on the anterior lip of the cervix to stabilize it. The 3 mm pipelle was introduced into the endometrial cavity without difficulty to a depth of 8cm, and a moderate amount of tissue was obtained and sent to pathology. The instruments were  removed from the patient's vagina. Minimal bleeding from the cervix was noted. The patient tolerated the procedure well. Routine post-procedure instructions were given to the patient.     No results found for this or any previous visit (from the past 24 hour(s)).  No results found.  Assessment/Plan: Menorrhagia:  Improved on megace, will send refill. EMB done today. Pt tolerated procedure well. Precautions given.   Cherre Blanc 05/31/2020, 11:32 AM

## 2020-05-31 NOTE — Addendum Note (Signed)
Addended by: Juanna Cao on: 05/31/2020 11:55 AM   Modules accepted: Orders

## 2020-05-31 NOTE — Progress Notes (Signed)
Reports vaginal bleeding that started 4-5 months ago. Was evaluated at ED 05/01/20. Was given blood transfusion for low hemoglobin and prescribed megace. Reports no bleeding since that time.   Apolonio Schneiders RN 05/31/20

## 2020-06-02 LAB — SURGICAL PATHOLOGY

## 2020-06-08 ENCOUNTER — Other Ambulatory Visit: Payer: Self-pay | Admitting: Obstetrics & Gynecology

## 2020-06-08 DIAGNOSIS — Z1231 Encounter for screening mammogram for malignant neoplasm of breast: Secondary | ICD-10-CM

## 2020-06-15 ENCOUNTER — Ambulatory Visit
Admission: RE | Admit: 2020-06-15 | Discharge: 2020-06-15 | Disposition: A | Discharge status: Home / Self Care | Payer: Self-pay | Source: Ambulatory Visit | Attending: Obstetrics & Gynecology | Admitting: Obstetrics & Gynecology

## 2020-06-15 ENCOUNTER — Other Ambulatory Visit: Payer: Self-pay

## 2020-06-15 DIAGNOSIS — Z1231 Encounter for screening mammogram for malignant neoplasm of breast: Secondary | ICD-10-CM

## 2020-06-17 ENCOUNTER — Other Ambulatory Visit: Payer: Self-pay | Admitting: Obstetrics & Gynecology

## 2020-06-17 DIAGNOSIS — R928 Other abnormal and inconclusive findings on diagnostic imaging of breast: Secondary | ICD-10-CM

## 2020-06-29 NOTE — Progress Notes (Signed)
Subjective:    Deborah Higgins - 45 y.o. female MRN [id number]  Date of birth: [birth year]  HPI  Deborah Higgins is to establish care. Patient has a PMH significant for thrombocytosis, mass of left kidney, iron deficiency anemia, leukocytosis, renal mass, and anemia.   Current issues and/or concerns:  1. Irregular Periods Follow-Up: Reports recent blood clots 3 days ago. Says periods are irregular. Reports still having cramping. Says prescribed medications from Gynecology no longer working. Has pelvic pressure and pain. States she recently had a biopsy and that she has not received results as of yet. Still taking iron pills. States she is not sure of the follow-up plan with Gynecology.    ROS per HPI   Health Maintenance:  Health Maintenance Due  Topic Date Due  . Hepatitis C Screening  Never done  . COVID-19 Vaccine (1) Never done  . TETANUS/TDAP  Never done  . COLONOSCOPY (Pts 45-20yrs Insurance coverage will need to be confirmed)  Never done  . PAP SMEAR-Modifier  05/28/2020    Past Medical History: Patient Active Problem List   Diagnosis Date Noted  . Anemia 05/01/2020  . Renal mass 08/29/2017  . Mass of left kidney 06/14/2017  . Iron deficiency anemia 06/14/2017  . Leukocytosis 06/14/2017  . Thrombocytosis 06/14/2017  . Nausea vomiting and diarrhea 06/14/2017    Social History   reports that she has never smoked. She has never used smokeless tobacco. She reports that she does not drink alcohol and does not use drugs.   Family History  family history includes Breast cancer in her maternal aunt.   Medications: reviewed and updated   Objective:   Physical Exam BP 132/87 (BP Location: Left Arm, Patient Position: Sitting)   Pulse 75   Ht 5\' 3"  (1.6 m)   Wt 236 lb 9.6 oz (107.3 kg)   SpO2 99%   BMI 41.91 kg/m  Physical Exam Constitutional:      Appearance: She is obese.  HENT:     Head: Normocephalic.  Eyes:     Extraocular  Movements: Extraocular movements intact.     Pupils: Pupils are equal, round, and reactive to light.  Cardiovascular:     Rate and Rhythm: Normal rate and regular rhythm.     Pulses: Normal pulses.     Heart sounds: Normal heart sounds.  Pulmonary:     Effort: Pulmonary effort is normal.     Breath sounds: Normal breath sounds.  Musculoskeletal:     Cervical back: Normal range of motion and neck supple.  Neurological:     General: No focal deficit present.     Mental Status: She is alert and oriented to person, place, and time.  Psychiatric:        Mood and Affect: Mood normal.        Behavior: Behavior normal.     Assessment & Plan:  1. Encounter to establish care: - Patient presents today to establish care.  - Return in 4 to 6 weeks or sooner if needed for annual physical examination, labs, and health maintenance. Arrive fasting meaning having had no food and/or nothing to drink for at least 8 hours prior to appointment.  2. Irregular periods: - Reports recent blood clots 3 days ago. Says periods are irregular. Reports still having cramping. Says prescribed medications from Gynecology no longer working. Has pelvic pressure and pain. States she recently had a biopsy and that she has not received results as of yet. Still taking iron pills.  States she is not sure of the follow-up plan with Gynecology.   - Counseled patient to contact Gynecology office for results and plan of care in regards to the biopsy she recently had. Patient agreeable.  3. Iron deficiency anemia, unspecified iron deficiency anemia type: - Anemia seems to be related to irregular periods.  - Continue Ferrous Sulfate as prescribed.  - Will update blood work at next appointment for annual physical examination.  4. Language barrier: [facility] in-person interpreter [place] participated during today's visit.  Ricky Stabs, NP 06/30/2020, 4:12 PM Primary Care at [facility]

## 2020-06-30 ENCOUNTER — Encounter: Payer: Self-pay | Admitting: Family

## 2020-06-30 ENCOUNTER — Other Ambulatory Visit: Payer: Self-pay

## 2020-06-30 ENCOUNTER — Ambulatory Visit (INDEPENDENT_AMBULATORY_CARE_PROVIDER_SITE_OTHER): Payer: Self-pay | Admitting: Family

## 2020-06-30 VITALS — BP 132/87 | HR 75 | Ht 63.0 in | Wt 236.6 lb

## 2020-06-30 DIAGNOSIS — N926 Irregular menstruation, unspecified: Secondary | ICD-10-CM

## 2020-06-30 DIAGNOSIS — D509 Iron deficiency anemia, unspecified: Secondary | ICD-10-CM

## 2020-06-30 DIAGNOSIS — Z789 Other specified health status: Secondary | ICD-10-CM

## 2020-06-30 DIAGNOSIS — Z7689 Persons encountering health services in other specified circumstances: Secondary | ICD-10-CM

## 2020-06-30 NOTE — Progress Notes (Signed)
Establish care

## 2020-06-30 NOTE — Patient Instructions (Addendum)
Continue iron pill for anemia.   Follow-up with Gynecology.    Anemia por deficiencia de hierro en los adultos Iron Deficiency Anemia, Adult La anemia por deficiencia de hierro ocurre cuando no hay suficientes glbulos rojos o hemoglobina en la sangre. Esto ocurre por la cantidad deficiente de Engineering geologist. La hemoglobina transporta oxgeno a todo el cuerpo. La anemia puede hacer que el cuerpo no obtenga suficiente oxgeno. Cules son las causas?  Consumo insuficiente de alimentos que contienen hierro.  Incapacidad del organismo de Armed forces logistics/support/administrative officer.  En las mujeres, necesidad de ms hierro debido a Nutritional therapist o a perodos menstruales abundantes.  Cncer.  Hemorragia intestinal.  Muchas extracciones de sangre. Qu incrementa el riesgo?  Estar embarazada.  Ser una adolescente que atraviesa un estirn puberal. [place] son los signos o sntomas?  Piel, labios y uas plidos.  Debilidad, mareos y cansarse con facilidad.  Dolor de [place].  Sensacin de que no puede respirar bien cuando se mueve (falta de aire).  Manos y pies fros.  Frecuencia cardaca rpida o latidos cardacos que no son regulares.  Sentirse de mal humor (irritabilidad) o tener respiracin rpida. Estos sntomas son ms frecuentes en los casos de anemia muy grave. Los casos leves de anemia podran no causar sntomas. Cmo se trata? El tratamiento de esta afeccin es descubrir por qu no tiene suficiente hierro y Air traffic controller suministrarle ms hierro. Puede incluir lo siguiente:  Agregar alimentos con alto contenido de hierro a Dentist.  Tomar comprimidos de hierro (suplementos). Si est embarazada o amamantando, es posible que necesite tomar hierro adicional. La dieta generalmente no le proporciona la cantidad de hierro que necesita.  Incluir ms vitamina C en su dieta. La vitamina C ayuda al organismo a Software engineer. Es posible que deba tomar comprimidos de hierro con un vaso de jugo  de naranja o comprimidos de vitamina C.  Medicamentos para disminuir los perodos menstruales abundantes.  [place]. Es posible que deba hacerse anlisis de sangre para ver si el tratamiento est funcionando. Si el tratamiento no funciona, es posible que deba realizarse otras pruebas. Siga estas instrucciones en su casa: Medicamentos  Use los medicamentos de venta libre y los recetados solamente como se lo haya indicado el mdico. Esto incluye los comprimidos de hierro y las vitaminas. ? [employer] comprimidos de hierro con el estmago vaco. Si no los [place], tmelos con la comida. ? No beba leche ni tome anticidos en el mismo momento que los comprimidos de hierro. ? Los comprimidos de hierro Designer, industrial/product materia fecal (heces)se vuelvan negras.  Si no tolera tomar comprimidos de hierro, pregntele al [employer] estas otras maneras de incorporar hierro: ? Un tubo (catter) intravenoso. ? Mediante una inyeccin (inyectable) en el msculo. Comida y bebida  Hable con su mdico antes de cambiar los alimentos que consume. Puede indicarle que coma alimentos que tengan gran cantidad de hierro, como, por ejemplo: ? Hgado. ? Carne de res con bajo contenido de grasa Kara Pacer). ? Panes y cereales con hierro agregado. ? Huevos. ? Frutas pasas. ? Verduras de hojas color verde oscuro.  Consuma frutas y verduras frescas con alto contenido de vitaminaC, ya que ayudan a que el organismo incorpore el hierro. Algunos alimentos que contienen gran cantidad de vitaminaC: ? Naranjas. ? Pimientos. ? Tomates. ? Mangos.  Beba suficiente lquido para Contractor pis (la orina) de color amarillo plido.   Control del estreimiento Si toma comprimidos de hierro, Photographer  causar problemas para defecar (estreimiento). Para prevenir o tratar los problemas para defecar, es posible que deba hacer lo siguiente:  Usar medicamentos recetados o de Radio broadcast assistant.  Comer alimentos ricos en fibra. Entre  ellos, frijoles, cereales integrales y frutas y verduras frescas.  Limitar los alimentos con alto contenido de grasa y Location manager. Estos incluyen alimentos fritos o dulces. Instrucciones generales  Retome sus actividades normales segn lo indicado por el mdico. Pregntele al mdico qu actividades son seguras para usted.  Mantenga la limpieza propia y la del ambiente que lo rodea.  Concurra a todas visitas de seguimiento como se lo haya indicado el mdico. Esto es importante. Comunquese con un mdico si:  Tiene ganas de vomitar (nuseas) o vomita.  Se siente dbil.  Philbert Riser sin motivo.  Tiene dificultades para defecar; por ejemplo: ? Defeca menos de 3 veces por semana. ? Hace esfuerzo para eliminar heces. ? Tiene heces secas y duras, o ms grandes que lo normal. ? Tiene la sensacin de estar lleno o hinchado. ? Siente dolor en el abdomen bajo. ? No se siente mejor despus de eliminar heces. Solicite ayuda de inmediato si:  Pierde el conocimiento (se desmaya).  Siente dolor en el pecho.  Tiene dificultad para respirar que: ? Es muy intensa. ? Empeora con la actividad fsica.  Tiene latidos cardacos acelerados o latidos cardacos que no se sienten regulares.  Se siente mareado al levantarse despus de haber estado sentado o acostado. Estos sntomas pueden Sales executive. No espere a ver si los sntomas desaparecen. Solicite atencin mdica de inmediato. Comunquese con el servicio de emergencias de su localidad (911 en los [place]). No conduzca por sus propios medios [employer] hospital. Resumen  La anemia por deficiencia de hierro se produce cuando hay muy poco hierro en el cuerpo.  El tratamiento de esta afeccin es descubrir por qu no tiene suficiente hierro en el cuerpo y luego suministrarle ms hierro.  Use los medicamentos de venta libre y los recetados solamente como se lo haya indicado el mdico.  Consuma frutas y verduras frescas con alto contenido  de vitaminaC.  Obtenga ayuda de inmediato si no puede respirar bien. Esta informacin no tiene Marine scientist el consejo del mdico. Asegrese de hacerle al mdico cualquier pregunta que tenga. Document Revised: 05/01/2019 Document Reviewed: 05/01/2019 Elsevier Patient Education  2021 [employer].

## 2020-07-01 ENCOUNTER — Telehealth: Payer: Self-pay | Admitting: *Deleted

## 2020-07-01 NOTE — Telephone Encounter (Signed)
A female left VM message stating that pt has questions about her results from 12/22. Pt would like to be called back with a Spanish interpreter. She can be reached @ [phone]

## 2020-07-06 NOTE — Telephone Encounter (Signed)
Left message on the number listed with Spanish Interpreter Raquel M. That we are returning her call and if she continues to have questions or concerns to please give office a call back.   Mel Almond, RN  07/06/20

## 2020-07-07 ENCOUNTER — Other Ambulatory Visit: Payer: Self-pay

## 2020-07-07 ENCOUNTER — Ambulatory Visit: Payer: Self-pay | Attending: Family Medicine

## 2020-07-19 ENCOUNTER — Encounter: Payer: Self-pay | Admitting: Family

## 2020-07-19 NOTE — Progress Notes (Signed)
Patient did not show for appointment.   

## 2020-07-20 ENCOUNTER — Telehealth: Payer: Self-pay | Admitting: Family Medicine

## 2020-07-20 NOTE — Telephone Encounter (Signed)
Pt was sent a letter from financial dept. Inform them, that the application they submitted was incomplete, since they were missing some documentation at the time of the appointment, Pt need to reschedule and resubmit all new papers and application for [employer] and [employer], P.S. old documents has been sent back by mail to the Pt and Pt. need to make a new appt. 

## 2020-07-21 ENCOUNTER — Other Ambulatory Visit: Payer: Self-pay

## 2020-07-21 ENCOUNTER — Encounter: Payer: Self-pay | Admitting: *Deleted

## 2020-07-21 ENCOUNTER — Ambulatory Visit (INDEPENDENT_AMBULATORY_CARE_PROVIDER_SITE_OTHER): Payer: Self-pay | Admitting: Family

## 2020-07-21 VITALS — BP 127/84 | HR 83 | Wt 236.2 lb

## 2020-07-21 DIAGNOSIS — Z789 Other specified health status: Secondary | ICD-10-CM

## 2020-07-21 DIAGNOSIS — Z13228 Encounter for screening for other metabolic disorders: Secondary | ICD-10-CM

## 2020-07-21 DIAGNOSIS — Z0000 Encounter for general adult medical examination without abnormal findings: Secondary | ICD-10-CM

## 2020-07-21 DIAGNOSIS — Z758 Other problems related to medical facilities and other health care: Secondary | ICD-10-CM

## 2020-07-21 DIAGNOSIS — Z23 Encounter for immunization: Secondary | ICD-10-CM

## 2020-07-21 DIAGNOSIS — Z1211 Encounter for screening for malignant neoplasm of colon: Secondary | ICD-10-CM

## 2020-07-21 DIAGNOSIS — Z1329 Encounter for screening for other suspected endocrine disorder: Secondary | ICD-10-CM

## 2020-07-21 DIAGNOSIS — D509 Iron deficiency anemia, unspecified: Secondary | ICD-10-CM

## 2020-07-21 DIAGNOSIS — Z1159 Encounter for screening for other viral diseases: Secondary | ICD-10-CM

## 2020-07-21 DIAGNOSIS — Z130 Encounter for screening for diseases of the blood and blood-forming organs and certain disorders involving the immune mechanism: Secondary | ICD-10-CM

## 2020-07-21 DIAGNOSIS — Z131 Encounter for screening for diabetes mellitus: Secondary | ICD-10-CM

## 2020-07-21 DIAGNOSIS — Z124 Encounter for screening for malignant neoplasm of cervix: Secondary | ICD-10-CM

## 2020-07-21 DIAGNOSIS — Z13220 Encounter for screening for lipoid disorders: Secondary | ICD-10-CM

## 2020-07-21 NOTE — Progress Notes (Signed)
Patient ID: Deborah Higgins, female    DOB: 01/16/1976  MRN: [id number]  CC: Annual Physical Exam   Subjective: Deborah Higgins is a 45 y.o. female who presents for annual physical exam.  Her concerns today include: none  Patient Active Problem List   Diagnosis Date Noted  . Anemia 05/01/2020  . Renal mass 08/29/2017  . Mass of left kidney 06/14/2017  . Iron deficiency anemia 06/14/2017  . Leukocytosis 06/14/2017  . Thrombocytosis 06/14/2017  . Nausea vomiting and diarrhea 06/14/2017     Current Outpatient Medications on File Prior to Visit  Medication Sig Dispense Refill  . docusate sodium (COLACE) 100 MG capsule Take 1 capsule (100 mg total) by mouth 2 (two) times daily as needed. 30 capsule 2  . ferrous sulfate (FERROUSUL) 325 (65 FE) MG tablet Take 1 tablet (325 mg total) by mouth every other day. 60 tablet 1  . megestrol (MEGACE) 40 MG tablet Take 1 tablet (40 mg total) by mouth 2 (two) times daily. 60 tablet 2   No current facility-administered medications on file prior to visit.    No Known Allergies  Social History   Socioeconomic History  . Marital status: Married    Spouse name: Not on file  . Number of children: Not on file  . Years of education: Not on file  . Highest education level: Not on file  Occupational History  . Not on file  Tobacco Use  . Smoking status: Never Smoker  . Smokeless tobacco: Never Used  Vaping Use  . Vaping Use: Never used  Substance and Sexual Activity  . Alcohol use: No  . Drug use: No  . Sexual activity: Yes    Birth control/protection: Coitus interruptus  Other Topics Concern  . Not on file  Social History Narrative  . Not on file   Social Determinants of Health   Financial Resource Strain: Not on file  Food Insecurity: Food Insecurity Present  . Worried About Charity fundraiser in the Last Year: Sometimes true  . Ran Out of Food in the Last Year: Sometimes true  Transportation Needs: Unmet  Transportation Needs  . Lack of Transportation (Medical): Yes  . Lack of Transportation (Non-Medical): Yes  Physical Activity: Not on file  Stress: Not on file  Social Connections: Not on file  Intimate Partner Violence: Not on file    Family History  Problem Relation Age of Onset  . Breast cancer Maternal Aunt     Past Surgical History:  Procedure Laterality Date  . CESAREAN SECTION  2007  . ROBOT ASSISTED LAPAROSCOPIC NEPHRECTOMY Left 08/29/2017   Procedure: XI ROBOTIC ASSISTED LAPAROSCOPIC NEPHRECTOMY;  Surgeon: Alexis Frock, MD;  Location: [facility];  Service: Urology;  Laterality: Left;    ROS: Review of Systems Negative except as stated above  PHYSICAL EXAM: BP 127/84 (BP Location: Left Arm, Patient Position: Sitting)   Pulse 83   Wt 236 lb 3.2 oz (107.1 kg)   SpO2 98%   BMI 41.84 kg/m    Wt Readings from Last 3 Encounters:  07/21/20 236 lb 3.2 oz (107.1 kg)  06/30/20 236 lb 9.6 oz (107.3 kg)  05/31/20 234 lb 14.4 oz (106.5 kg)   Physical Exam Exam conducted with a chaperone present.  Constitutional:      Appearance: She is obese.  HENT:     Head: Normocephalic and atraumatic.     Right Ear: Tympanic membrane, ear canal and external ear normal.  Left Ear: Tympanic membrane, ear canal and external ear normal.     Nose: Nose normal.     Mouth/Throat:     Mouth: Mucous membranes are moist.     Pharynx: Oropharynx is clear.  Eyes:     Extraocular Movements: Extraocular movements intact.     Conjunctiva/sclera: Conjunctivae normal.     Pupils: Pupils are equal, round, and reactive to light.  Cardiovascular:     Rate and Rhythm: Normal rate and regular rhythm.     Pulses: Normal pulses.     Heart sounds: Normal heart sounds.  Pulmonary:     Effort: Pulmonary effort is normal.     Breath sounds: Normal breath sounds.  Chest:  Breasts:     Right: Normal.     Left: Normal.      Comments: Elmon Else, CMA present during examination.  Abdominal:      General: Bowel sounds are normal.     Palpations: Abdomen is soft.  Genitourinary:    Comments: Patient declined examination.  Musculoskeletal:        General: Normal range of motion.  Skin:    General: Skin is warm and dry.     Capillary Refill: Capillary refill takes less than 2 seconds.  Neurological:     General: No focal deficit present.     Mental Status: She is alert and oriented to person, place, and time.  Psychiatric:        Mood and Affect: Mood normal.        Behavior: Behavior normal.    ASSESSMENT AND PLAN: 1. Annual physical exam: Counseled on 150 minutes of exercise per week as tolerated, healthy eating (including decreased daily intake of saturated fats, cholesterol, added sugars, sodium), STI prevention, and routine healthcare maintenance.  2. Screening for metabolic disorder: - CMP to check kidney function, liver function, and electrolyte balance.  - Comprehensive metabolic panel  3. Screening for deficiency anemia: - CBC and iron panel to screen for anemia.  - CBC - Iron, TIBC and Ferritin Panel  4. Diabetes mellitus screening: - Hemoglobin A1c to screen for pre-diabetes/diabetes. - Hemoglobin A1c  5. Screening cholesterol level: - Lipid panel to screen for high cholesterol.  - Lipid Panel  6. Thyroid disorder screen: - TSH to check thyroid function.  - TSH  7. Need for hepatitis C screening test: - HCV to screen for hepatitis C.  - HCV Ab w/Rflx to Verification  8. Colon cancer screening: - Referral to Gastroenterology to screen for colon cancer by colonoscopy.  - Ambulatory referral to Gastroenterology  9. Cervical cancer screening: - Referral to Gynecology for cervical cancer screening by PAP smear.  - Ambulatory referral to Gynecology  10. Need for Tdap vaccination: - Tdap vaccine administered during today's visit.   11. Iron deficiency anemia, unspecified iron deficiency anemia type: - Two iron infusions for critical low iron. -  Will recheck CBC and iron panel at least 1 month after completion of second infusion.   - Follow-up with primary provider as scheduled.  - ferumoxytol 510 mg in sodium chloride 0.9 % 100 mL; Inject 510 mg into the vein once for 1 dose. Two-dose regimen: 510 mg once; after 3 to 8 days, administer a second dose of 510 mg once. Frequency: once @ 468 mL/hr over 15 minutes.  Administration instructions: Monitor patients carefully for allergic reactions during Feraheme administration and for at least 30 minutes following each infusion. Allow minimum of 30 minutes between Feraheme and administration of  medications that could potentially cause hypersensitivity reactions, such as chemotherapeutic agents or monoclonal antibodies.  Dispense: 1 each; Refill: 0 - ferumoxytol 510 mg in sodium chloride 0.9 % 100 mL; Inject 510 mg into the vein once for 1 dose. Two-dose regimen: 510 mg once; after 3 to 8 days, administer a second dose of 510 mg once. Frequency: once @ 468 mL/hr over 15 minutes.  Administration instructions: Monitor patients carefully for allergic reactions during Feraheme administration and for at least 30 minutes following each infusion. Allow minimum of 30 minutes between Feraheme and administration of medications that could potentially cause hypersensitivity reactions, such as chemotherapeutic agents or monoclonal antibodies.  Dispense: 1 each; Refill: 0  12. Language barrier: - [employer] participated during today's visit.  - Interpreter nameLisbeth Renshaw, ID#: [id number].  Patient was given the opportunity to ask questions.  Patient verbalized understanding of the plan and was able to repeat key elements of the plan. Patient was given clear instructions to go to Emergency Department or return to medical center if symptoms don't improve, worsen, or new problems develop.The patient verbalized understanding.   Orders Placed This Encounter  Procedures  . Comprehensive metabolic panel  . CBC  .  Iron, TIBC and Ferritin Panel  . Hemoglobin A1c  . Lipid Panel  . TSH  . HCV Ab w/Rflx to Verification  . Interpretation:  . Ambulatory referral to Gastroenterology     Requested Prescriptions   Signed Prescriptions Disp Refills  . ferumoxytol 510 mg in sodium chloride 0.9 % 100 mL 1 each 0    Sig: Inject 510 mg into the vein once for 1 dose. Two-dose regimen: 510 mg once; after 3 to 8 days, administer a second dose of 510 mg once. Frequency: once @ 468 mL/hr over 15 minutes.  Administration instructions: Monitor patients carefully for allergic reactions during Feraheme administration and for at least 30 minutes following each infusion. Allow minimum of 30 minutes between Feraheme and administration of medications that could potentially cause hypersensitivity reactions, such as chemotherapeutic agents or monoclonal antibodies.  . ferumoxytol 510 mg in sodium chloride 0.9 % 100 mL 1 each 0    Sig: Inject 510 mg into the vein once for 1 dose. Two-dose regimen: 510 mg once; after 3 to 8 days, administer a second dose of 510 mg once. Frequency: once @ 468 mL/hr over 15 minutes.  Administration instructions: Monitor patients carefully for allergic reactions during Feraheme administration and for at least 30 minutes following each infusion. Allow minimum of 30 minutes between Feraheme and administration of medications that could potentially cause hypersensitivity reactions, such as chemotherapeutic agents or monoclonal antibodies.    Camillia Herter, NP

## 2020-07-21 NOTE — Patient Instructions (Addendum)
Examen fsico anual y laboratorios hoy. Llamar con resultados.  Vacuna contra el ttanos hoy.  Referencia para colonoscopia.  Seguimiento con el proveedor primario segn sea necesario.   Annual physical exam and labs today. Will call with results.   Tetanus vaccine today.   Referral for colonoscopy.   Follow-up with primary provider as needed.  Colonoscopia en los adultos Colonoscopy, Adult [place] colonoscopia es un procedimiento que se realiza para examinar todo el intestino grueso. Este procedimiento se [employer] de un tubo [place], fino y flexible que tiene una cmara en el extremo. Es posible que necesite una colonoscopa:  Como parte de las pruebas normales de Programme researcher, broadcasting/film/video de Surveyor, minerals.  Si tiene ciertos sntomas como: ? Recuento bajo de glbulos rojos en la sangre (anemia). ? Diarrea que no se resuelve. ? Dolor en el abdomen. ? [employer]. Una colonoscopa puede ayudar a Hydrographic surveyor y [employer] siguientes problemas mdicos, [place] otros:  Tumores.  Crecimiento de tejido en la zona donde se forma la mucosidad (plipos).  Inflamacin.  Zonas de hemorragias. Informe al mdico sobre:  Cualquier alergia que tenga.  Todos los [employer] [place], incluidos vitaminas, hierbas, gotas oftlmicas, cremas y medicamentos de venta libre.  Cualquier problema previo que usted o algn miembro de su familia hayan tenido con los anestsicos.  Cualquier trastorno de la sangre que tenga.  Cirugas a las que se haya sometido.  Cualquier afeccin mdica que tenga.  Cualquier problema que haya tenido con sus deposiciones.  Si est embarazada o podra estarlo. Cules son los riesgos? En general, se trata de un procedimiento seguro. Sin embargo, pueden ocurrir complicaciones, por ejemplo:  Sangrado.  Dao intestinal.  Reacciones alrgicas a los medicamentos administrados durante el procedimiento.  Infeccin. Esto es poco frecuente. Qu  ocurre antes del procedimiento? Restricciones en las comidas y bebidas Siga las indicaciones del mdico respecto de las restricciones para las comidas o las bebidas, las cuales pueden incluir lo siguiente:  [employer] antes del procedimiento: ? Trudi Ida dieta con bajo contenido de [place]. ? Evite los frutos secos, las semillas, las frutas pasas, las frutas crudas y las verduras.  [place] 1 y 3 das antes del procedimiento: ? Coma solo postre de gelatina o helados de [place]. ? Beba solamente lquidos claros, como agua, jugo claro, caldo o sopa clara, caf negro o t, refrescos o bebidas deportivas. ? Evite los lquidos que contengan colorantes rojos o morados.  El da del procedimiento: ? No coma alimentos slidos. Puede continuar bebiendo lquidos claros hasta 2 horas antes del procedimiento. ? No coma ni beba nada a partir de 2 horas antes del procedimiento o durante el lapso de tiempo que el [employer] recomiende. Preparado intestinal Si le recetaron un preparado intestinal para tomar por la boca (por va oral) para limpiar el colon:  Tmelo como se lo haya indicado el mdico. Desde el da anterior al procedimiento, tendr que beber una gran cantidad de un medicamento lquido. El lquido har que tenga muchas deposiciones con heces blandas [employer] las heces se tornen casi claras o de color verde claro.  Si la piel o la abertura [employer] nalgas (ano) se irritan debido a la diarrea, puede Public house manager la irritacin utilizando: ? Toallitas que contengan medicamentos, por ejemplo toallitas hmedas para adultos con aloe y vitamina E. ? Un producto para suavizar la piel, como vaselina.  Si vomita mientras toma el preparado intestinal: ? Lorayne Marek pausa durante 60 minutos como mximo. ?  Comience a tomar el preparado nuevamente. ? Llame al mdico si contina vomitando o no puede tomar el preparado sin vomitar.  Para limpiarle el colon, tambin pueden darle: ? Medicamentos laxantes. Estos ayudan a  Teaching laboratory technician. ? Instrucciones para el uso de un enema. Un enema es un medicamento lquido que se inyecta en el recto. Medicamentos Consulte al mdico sobre:  Quarry manager o suspender sus medicamentos o suplementos. Esto es muy importante si toma suplementos de hierro, medicamentos para la diabetes o anticoagulantes.  Tomar medicamentos como aspirina e ibuprofeno. Estos medicamentos pueden tener un efecto anticoagulante en la [place]. No tome estos medicamentos a menos que el mdico se lo indique.  Usar medicamentos de venta libre, vitaminas, hierbas y suplementos. Instrucciones generales  Pregntele al mdico qu medidas se tomarn para ayudar a prevenir una infeccin. Estas pueden incluir lavar la piel con un jabn antisptico.  Prepare que alguien lo lleve a su casa desde el hospital o la clnica. Qu ocurre durante el procedimiento?  Le colocarn una va intravenosa en una vena.  Pueden administrarle uno o ms de los siguientes medicamentos: ? Un medicamento para ayudar a relajarse (sedante). ? Un medicamento para adormecer la zona (anestesia local). ? Un medicamento que lo har dormir (anestesia general). Esto debe realizarse en contadas ocasiones.  Se recostar de costado con las rodillas flexionadas.  El tubo: ? Estar recubierto de aceite o gel (estar lubricado). ? Se colocar a travs del ano. ? Se mover suavemente a lo largo de todas las partes del intestino grueso.  Le aplicarn aire en el colon para mantenerlo abierto. Esto puede causar algo de presin o calambres.  Con una cmara, se tomarn imgenes que aparecern en una pantalla.  Pueden extraerle Radford Pax de tejido para analizarla con un microscopio (biopsia). El tejido se enviar a un laboratorio para ser analizado si se detectan signos de problemas.  Si se encuentran pequeos plipos, pueden extirparse y analizarse para detectar clulas cancerosas.  Cuando el procedimiento haya finalizado, se  retirar el tubo. El procedimiento puede variar segn el mdico y el hospital.   Sander Nephew ocurre despus del procedimiento?  Le controlarn la presin arterial, la frecuencia cardaca, la frecuencia respiratoria y Retail buyer de oxgeno en la sangre hasta que le den el alta del hospital o la clnica.  Es posible que encuentre una pequea cantidad de sangre en la materia fecal.  Es posible que tenga gases y clicos leves o distensin en el abdomen. Esto se produce a causa del aire que se [place] para abrir el colon durante el procedimiento.  No conduzca durante 24horas despus del procedimiento.  Es su responsabilidad retirar [employer] del procedimiento. Pregntele a su mdico, o a un miembro del personal del departamento donde se realice el procedimiento, cundo estarn [employer]. Resumen  Una colonoscopia es un procedimiento que se realiza para examinar todo el intestino grueso.  Siga las indicaciones del mdico con respecto a lo que puede comer y beber antes del procedimiento.  Si le recetaron un preparado intestinal por va oral para limpiar el colon, tmelo como se lo haya indicado el mdico.  Durante la colonoscopa, un tubo flexible con una cmara en su extremo se introduce en el ano y se hace avanzar por otras partes del intestino grueso. Esta informacin no tiene Marine scientist el consejo del mdico. Asegrese de hacerle al mdico cualquier pregunta que tenga. Document Revised: 02/12/2019 Document Reviewed: 02/12/2019 Elsevier Patient Education  2021 [employer].

## 2020-07-21 NOTE — Progress Notes (Signed)
Physical

## 2020-07-21 NOTE — Progress Notes (Signed)
Patient came by office. States wants to get results from Endometrial biopsy done in December. From chart review can see attempts made to reach patient.  Informed patient with Interpreter Deborah Higgins endometrial biopsy was negative for  Malignancy( cancer ). She voices understanding and appreciation. Reviewed provider notes from last visit re: if follow up needed. Note noted in notes. Williamson Cavanah,RN

## 2020-07-22 LAB — COMPREHENSIVE METABOLIC PANEL
ALT: 25 IU/L (ref 0–32)
AST: 16 IU/L (ref 0–40)
Albumin/Globulin Ratio: 1.3 (ref 1.2–2.2)
Albumin: 4.4 g/dL (ref 3.8–4.8)
Alkaline Phosphatase: 84 IU/L (ref 44–121)
BUN/Creatinine Ratio: 18 (ref 9–23)
BUN: 17 mg/dL (ref 6–24)
Bilirubin Total: 0.3 mg/dL (ref 0.0–1.2)
CO2: 20 mmol/L (ref 20–29)
Calcium: 10.1 mg/dL (ref 8.7–10.2)
Chloride: 103 mmol/L (ref 96–106)
Creatinine, Ser: 0.93 mg/dL (ref 0.57–1.00)
GFR calc Af Amer: 85 mL/min/{1.73_m2} (ref >59)
GFR calc non Af Amer: 74 mL/min/{1.73_m2} (ref >59)
Globulin, Total: 3.3 g/dL (ref 1.5–4.5)
Glucose: 93 mg/dL (ref 65–99)
Potassium: 4.4 mmol/L (ref 3.5–5.2)
Sodium: 139 mmol/L (ref 134–144)
Total Protein: 7.7 g/dL (ref 6.0–8.5)

## 2020-07-22 LAB — CBC
Hematocrit: 35.4 % (ref 34.0–46.6)
Hemoglobin: 10.7 g/dL — ABNORMAL LOW (ref 11.1–15.9)
MCH: 23.6 pg — ABNORMAL LOW (ref 26.6–33.0)
MCHC: 30.2 g/dL — ABNORMAL LOW (ref 31.5–35.7)
MCV: 78 fL — ABNORMAL LOW (ref 79–97)
Platelets: 399 10*3/uL (ref 150–450)
RBC: 4.53 x10E6/uL (ref 3.77–5.28)
RDW: 17.9 % — ABNORMAL HIGH (ref 11.7–15.4)
WBC: 7.3 10*3/uL (ref 3.4–10.8)

## 2020-07-22 LAB — LIPID PANEL
Chol/HDL Ratio: 4.7 ratio — ABNORMAL HIGH (ref 0.0–4.4)
Cholesterol, Total: 135 mg/dL (ref 100–199)
HDL: 29 mg/dL — ABNORMAL LOW (ref >39)
LDL Chol Calc (NIH): 83 mg/dL (ref 0–99)
Triglycerides: 129 mg/dL (ref 0–149)
VLDL Cholesterol Cal: 23 mg/dL (ref 5–40)

## 2020-07-22 LAB — HEMOGLOBIN A1C
Est. average glucose Bld gHb Est-mCnc: 114 mg/dL
Hgb A1c MFr Bld: 5.6 % (ref 4.8–5.6)

## 2020-07-22 LAB — HCV INTERPRETATION

## 2020-07-22 LAB — IRON,TIBC AND FERRITIN PANEL
Ferritin: 15 ng/mL (ref 15–150)
Iron Saturation: 5 % — CL (ref 15–55)
Iron: 19 ug/dL — ABNORMAL LOW (ref 27–159)
Total Iron Binding Capacity: 374 ug/dL (ref 250–450)
UIBC: 355 ug/dL (ref 131–425)

## 2020-07-22 LAB — HCV AB W/RFLX TO VERIFICATION: HCV Ab: <0.1 s/co ratio (ref 0.0–0.9)

## 2020-07-22 LAB — TSH: TSH: 1.97 u[IU]/mL (ref 0.450–4.500)

## 2020-07-23 ENCOUNTER — Telehealth: Payer: Self-pay

## 2020-07-23 MED ORDER — SODIUM CHLORIDE 0.9 % IV SOLN: 510.0000 mg | Freq: Once | INTRAVENOUS | 0 refills | Status: AC

## 2020-07-23 MED ORDER — FERUMOXYTOL INJECTION 510 MG/17 ML: 510.0000 mg | Freq: Once | INTRAVENOUS | 0 refills | Status: DC

## 2020-07-23 NOTE — Telephone Encounter (Signed)
Appts for Feraheme infusions have been scheduled for pt.. Att to contact pt to advise no ans lvm for pt to call ofc, if pt return call please advise pt of scheduled appts

## 2020-07-25 NOTE — Progress Notes (Signed)
Please call patient with update.   Kidney function normal.   Liver function normal.  Thyroid normal.   No pre-diabetes/diabetes.   Hepatitis C negative.  Iron critically low. Ordered two iron infusions (nurse has already scheduled appointments for these). Will repeat iron blood work at least 1 month after completion of both iron infusions.   HDL cholesterol, also known as "good cholesterol", lower than normal. Cholesterol higher than expected. Consider eating more fruits, vegetables, and lean baked meats such as chicken or fish. Moderate intensity exercise at least 150 minutes as tolerated per week  and weight management may help as well.

## 2020-07-26 ENCOUNTER — Encounter (INDEPENDENT_AMBULATORY_CARE_PROVIDER_SITE_OTHER): Payer: Self-pay

## 2020-07-27 ENCOUNTER — Telehealth: Payer: Self-pay | Admitting: Family

## 2020-07-27 NOTE — Telephone Encounter (Signed)
Natalie from [facility] is calling regarding the Iron Infusion appts scheduled for this Pt.

## 2020-07-27 NOTE — Telephone Encounter (Signed)
Refaxed signed orders for iron infusion

## 2020-07-29 ENCOUNTER — Encounter (INDEPENDENT_AMBULATORY_CARE_PROVIDER_SITE_OTHER): Payer: Self-pay

## 2020-07-29 ENCOUNTER — Encounter (HOSPITAL_COMMUNITY): Payer: Self-pay

## 2020-08-04 ENCOUNTER — Non-Acute Institutional Stay (HOSPITAL_COMMUNITY)
Admission: RE | Admit: 2020-08-04 | Discharge: 2020-08-04 | Disposition: A | Discharge status: Home / Self Care | Payer: Self-pay | Source: Ambulatory Visit | Attending: Internal Medicine | Admitting: Internal Medicine

## 2020-08-04 ENCOUNTER — Other Ambulatory Visit: Payer: Self-pay

## 2020-08-04 DIAGNOSIS — D509 Iron deficiency anemia, unspecified: Secondary | ICD-10-CM | POA: Insufficient documentation

## 2020-08-04 MED ORDER — SODIUM CHLORIDE 0.9 % IV SOLN
510.0000 mg | Freq: Once | INTRAVENOUS | Status: AC
  Administered 2020-08-04: 510 mg via INTRAVENOUS
  Filled 2020-08-04: qty 510

## 2020-08-04 MED ORDER — SODIUM CHLORIDE 0.9 % IV SOLN
INTRAVENOUS | Status: DC | PRN
  Administered 2020-08-04: 250 mL via INTRAVENOUS

## 2020-08-04 NOTE — Progress Notes (Signed)
Patient received IV Feraheme ( dose 1 of 2) as ordered by Elspeth Cho NP. Observed for at least 30 minutes post infusion. Tolerated well, vitals stable, discharge instructions given , verbalized understanding. Patient alert, oriented, and ambulatory at the time of discharge.

## 2020-08-04 NOTE — Discharge Instructions (Signed)
Ferumoxytol injection Qu es este medicamento? El FERUMOXITOL es un complejo de hierro. El hierro se usa para producir glbulos rojos saludables, que transportan oxgeno y nutrientes por el cuerpo. Este medicamento se usa para tratar la anemia por deficiencia de hierro. Este medicamento puede ser utilizado para otros usos; si tiene alguna pregunta consulte con su proveedor de atencin mdica o con su farmacutico. MARCAS COMUNES: Feraheme Qu le debo informar a mi profesional de la salud antes de tomar este medicamento? Necesita saber si usted presenta alguno de los siguientes problemas o situaciones:  anemia no provocada por niveles bajos de hierro  niveles altos de hierro en la sangre  examen de imgenes por resonancia magntica (IRM) programado  una reaccin alrgica o inusual al hierro, otros medicamentos, alimentos, colorantes o conservantes  si est embarazada o buscando quedar embarazada  si est amamantando a un beb Cmo debo utilizar este medicamento? Este medicamento se administra mediante inyeccin por va intravenosa. Lo administra un profesional de la salud en un hospital o en un entorno clnico. Hable con su pediatra para informarse acerca del uso de este medicamento en nios. Puede requerir atencin especial. Sobredosis: Pngase en contacto inmediatamente con un centro toxicolgico o una sala de urgencia si usted cree que haya tomado demasiado medicamento. ATENCIN: Este medicamento es solo para usted. No comparta este medicamento con nadie. Qu sucede si me olvido de una dosis? Es importante no olvidar ninguna dosis. Informe a su mdico o a su profesional de la salud si no puede asistir a una cita. Qu puede interactuar con este medicamento? Esta medicina puede interactuar con los siguientes medicamentos:  otros productos de hierro Puede ser que esta lista no menciona todas las posibles interacciones. Informe a su profesional de la salud de todos los productos a  base de hierbas, medicamentos de venta libre o suplementos nutritivos que est tomando. Si usted fuma, consume bebidas alcohlicas o si utiliza drogas ilegales, indqueselo tambin a su profesional de la salud. Algunas sustancias pueden interactuar con su medicamento. A qu debo estar atento al usar este medicamento? Visite a su mdico o a su profesional de la salud de manera regular. Si los sntomas no comienzan a mejorar o si empeoran, consulte con su mdico o con su profesional de la salud. Tal vez necesita realizarse anlisis de sangre mientras recibe este medicamento. Tal vez necesita seguir una dieta especial. Consulte a su mdico. Los alimentos que contienen hierro incluyen: alimentos integrales o con cereales, frutas secas, frijoles o arvejas, vegetales de hoja verde y carne que proviene de rganos (hgado, rin). Qu efectos secundarios puedo tener al utilizar este medicamento? Efectos secundarios que debe informar a su mdico o a su profesional de la salud tan pronto como sea posible: reacciones alrgicas, como erupcin cutnea, picazn o urticarias, e hinchazn de la cara, los labios o la lengua problemas respiratorios cambios en la presin sangunea sensacin de desmayos o aturdimiento, cadas fiebre o escalofros enrojecimiento, sudoracin o sensacin de calor hinchazn de tobillos o pies Efectos secundarios que generalmente no requieren atencin mdica (infrmelos a su mdico o a su profesional de la salud si persisten o si son molestos): diarrea dolor de cabeza nuseas, vmito dolor estomacal Puede ser que esta lista no menciona todos los posibles efectos secundarios. Comunquese a su mdico por asesoramiento mdico sobre los efectos secundarios. Usted puede informar los efectos secundarios a la FDA por telfono al 1-800-FDA-1088. Dnde debo guardar mi medicina? Este medicamento se administra en hospitales o clnicas y no necesitar guardarlo   en su domicilio. ATENCIN: Este folleto es un  resumen. Puede ser que no cubra toda la posible informacin. Si usted tiene preguntas acerca de esta medicina, consulte con su mdico, su farmacutico o su profesional de Technical sales engineer.  2021 Elsevier/Gold Standard (2016-09-19 00:00:00) Ferumoxytol injection What is this medicine? FERUMOXYTOL is an iron complex. Iron is used to make healthy red blood cells, which carry oxygen and nutrients throughout the body. This medicine is used to treat iron deficiency anemia. This medicine may be used for other purposes; ask your health care provider or pharmacist if you have questions. COMMON BRAND NAME(S): Feraheme What should I tell my health care provider before I take this medicine? They need to know if you have any of these conditions:  anemia not caused by low iron levels  high levels of iron in the blood  magnetic resonance imaging (MRI) test scheduled  an unusual or allergic reaction to iron, other medicines, foods, dyes, or preservatives  pregnant or trying to get pregnant  breast-feeding How should I use this medicine? This medicine is for injection into a vein. It is given by a health care professional in a hospital or clinic setting. Talk to your pediatrician regarding the use of this medicine in children. Special care may be needed. Overdosage: If you think you have taken too much of this medicine contact a poison control center or emergency room at once. NOTE: This medicine is only for you. Do not share this medicine with others. What if I miss a dose? It is important not to miss your dose. Call your doctor or health care professional if you are unable to keep an appointment. What may interact with this medicine? This medicine may interact with the following medications:  other iron products This list may not describe all possible interactions. Give your health care provider a list of all the medicines, herbs, non-prescription drugs, or dietary supplements you use. Also tell them if  you smoke, drink alcohol, or use illegal drugs. Some items may interact with your medicine. What should I watch for while using this medicine? Visit your doctor or healthcare professional regularly. Tell your doctor or healthcare professional if your symptoms do not start to get better or if they get worse. You may need blood work done while you are taking this medicine. You may need to follow a special diet. Talk to your doctor. Foods that contain iron include: whole grains/cereals, dried fruits, beans, or peas, leafy green vegetables, and organ meats (liver, kidney). What side effects may I notice from receiving this medicine? Side effects that you should report to your doctor or health care professional as soon as possible:  allergic reactions like skin rash, itching or hives, swelling of the face, lips, or tongue  breathing problems  changes in blood pressure  feeling faint or lightheaded, falls  fever or chills  flushing, sweating, or hot feelings  swelling of the ankles or feet Side effects that usually do not require medical attention (report to your doctor or health care professional if they continue or are bothersome):  diarrhea  headache  nausea, vomiting  stomach pain This list may not describe all possible side effects. Call your doctor for medical advice about side effects. You may report side effects to FDA at 1-800-FDA-1088. Where should I keep my medicine? This drug is given in a hospital or clinic and will not be stored at home. NOTE: This sheet is a summary. It may not cover all possible information. If  you have questions about this medicine, talk to your doctor, pharmacist, or health care provider.  2021 Elsevier/Gold Standard (2016-07-17 20:21:10)

## 2020-08-09 ENCOUNTER — Encounter (HOSPITAL_COMMUNITY): Payer: Self-pay

## 2020-08-13 ENCOUNTER — Other Ambulatory Visit: Payer: Self-pay

## 2020-08-13 ENCOUNTER — Non-Acute Institutional Stay (HOSPITAL_COMMUNITY)
Admission: RE | Admit: 2020-08-13 | Discharge: 2020-08-13 | Disposition: A | Discharge status: Home / Self Care | Payer: Self-pay | Source: Ambulatory Visit | Attending: Internal Medicine | Admitting: Internal Medicine

## 2020-08-13 DIAGNOSIS — D509 Iron deficiency anemia, unspecified: Secondary | ICD-10-CM | POA: Insufficient documentation

## 2020-08-13 MED ORDER — SODIUM CHLORIDE 0.9 % IV SOLN
INTRAVENOUS | Status: DC | PRN
  Administered 2020-08-13: 250 mL via INTRAVENOUS

## 2020-08-13 MED ORDER — SODIUM CHLORIDE 0.9 % IV SOLN
510.0000 mg | Freq: Once | INTRAVENOUS | Status: AC
  Administered 2020-08-13: 510 mg via INTRAVENOUS
  Filled 2020-08-13: qty 510

## 2020-08-13 NOTE — Discharge Instructions (Signed)
Ferumoxytol injection What is this medicine? FERUMOXYTOL is an iron complex. Iron is used to make healthy red blood cells, which carry oxygen and nutrients throughout the body. This medicine is used to treat iron deficiency anemia. This medicine may be used for other purposes; ask your health care provider or pharmacist if you have questions. COMMON BRAND NAME(S): Feraheme What should I tell my health care provider before I take this medicine? They need to know if you have any of these conditions:  anemia not caused by low iron levels  high levels of iron in the blood  magnetic resonance imaging (MRI) test scheduled  an unusual or allergic reaction to iron, other medicines, foods, dyes, or preservatives  pregnant or trying to get pregnant  breast-feeding How should I use this medicine? This medicine is for injection into a vein. It is given by a health care professional in a hospital or clinic setting. Talk to your pediatrician regarding the use of this medicine in children. Special care may be needed. Overdosage: If you think you have taken too much of this medicine contact a poison control center or emergency room at once. NOTE: This medicine is only for you. Do not share this medicine with others. What if I miss a dose? It is important not to miss your dose. Call your doctor or health care professional if you are unable to keep an appointment. What may interact with this medicine? This medicine may interact with the following medications:  other iron products This list may not describe all possible interactions. Give your health care provider a list of all the medicines, herbs, non-prescription drugs, or dietary supplements you use. Also tell them if you smoke, drink alcohol, or use illegal drugs. Some items may interact with your medicine. What should I watch for while using this medicine? Visit your doctor or healthcare professional regularly. Tell your doctor or healthcare  professional if your symptoms do not start to get better or if they get worse. You may need blood work done while you are taking this medicine. You may need to follow a special diet. Talk to your doctor. Foods that contain iron include: whole grains/cereals, dried fruits, beans, or peas, leafy green vegetables, and organ meats (liver, kidney). What side effects may I notice from receiving this medicine? Side effects that you should report to your doctor or health care professional as soon as possible:  allergic reactions like skin rash, itching or hives, swelling of the face, lips, or tongue  breathing problems  changes in blood pressure  feeling faint or lightheaded, falls  fever or chills  flushing, sweating, or hot feelings  swelling of the ankles or feet Side effects that usually do not require medical attention (report to your doctor or health care professional if they continue or are bothersome):  diarrhea  headache  nausea, vomiting  stomach pain This list may not describe all possible side effects. Call your doctor for medical advice about side effects. You may report side effects to FDA at 1-800-FDA-1088. Where should I keep my medicine? This drug is given in a hospital or clinic and will not be stored at home. NOTE: This sheet is a summary. It may not cover all possible information. If you have questions about this medicine, talk to your doctor, pharmacist, or health care provider.  2021 Elsevier/Gold Standard (2016-07-17 20:21:10) Ferumoxytol injection Qu es este medicamento? El FERUMOXITOL es un complejo de hierro. El hierro se [place] para producir glbulos rojos saludables, que transportan  oxgeno y nutrientes por el cuerpo. Este medicamento se [place] para tratar la anemia por deficiencia de hierro. Este medicamento puede ser utilizado para otros usos; si tiene alguna pregunta consulte con su proveedor de atencin mdica o con su farmacutico. MARCAS COMUNES:  Feraheme Qu le debo informar a mi profesional de la salud antes de tomar este medicamento? Necesita saber si usted presenta alguno de los [employer] o situaciones:  anemia no provocada por niveles bajos de hierro  niveles altos de hierro en la sangre  examen de imgenes por Health visitor (IRM) programado  una reaccin alrgica o inusual al hierro, otros medicamentos, alimentos, colorantes o conservantes  si est embarazada o buscando quedar embarazada  si est amamantando a un beb Cmo debo Insurance account manager medicamento? Este medicamento se administra mediante inyeccin por va intravenosa. Lo administra un profesional de Technical sales engineer en un hospital o en un entorno clnico. Hable con su pediatra para informarse acerca del uso de este medicamento en nios. Puede requerir atencin especial. Sobredosis: Pngase en contacto inmediatamente con un centro toxicolgico o una sala de urgencia si usted cree que haya tomado demasiado medicamento. ATENCIN: [employer] es solo para usted. No comparta este medicamento con nadie. Qu sucede si me olvido de una dosis? Es importante no olvidar ninguna dosis. Informe a su mdico o a su profesional de la salud si no puede asistir a Photographer. Qu puede interactuar con este medicamento? Esta medicina puede interactuar con los siguientes medicamentos:  otros productos de hierro Puede ser que esta lista no menciona todas las posibles interacciones. Informe a su profesional de [employer]	[place] de [employer] productos a base de hierbas, medicamentos de [place] o suplementos nutritivos que est tomando. Si usted fuma, consume bebidas alcohlicas o si utiliza drogas ilegales, indqueselo tambin a su profesional de [employer]	[place]. Algunas sustancias pueden interactuar con su medicamento. A qu debo estar atento al usar [employer]? Visite a su mdico o a su profesional de la salud de [employer] regular. Si los sntomas no comienzan a mejorar o si  empeoran, consulte con su mdico o con su profesional de [employer]	[place]. Tal vez necesita realizarse anlisis de sangre mientras recibe [place]. Tal vez necesita seguir Counselling psychologist. Consulte a su mdico. Los alimentos que contienen hierro incluyen: alimentos integrales o con cereales, frutas secas, frijoles o arvejas, vegetales de hoja verde y carne que proviene de rganos (hgado, rin). Qu efectos secundarios puedo tener al [employer] este medicamento? Efectos secundarios que debe informar a su mdico o a Barrister's clerk de la salud tan pronto como sea posible: Chief of Staff, como erupcin cutnea, picazn o urticarias, e hinchazn de la cara, los labios o la lengua problemas respiratorios cambios en la presin sangunea sensacin de desmayos o aturdimiento, cadas fiebre o escalofros enrojecimiento, sudoracin o sensacin de calor hinchazn de tobillos o pies Efectos secundarios que generalmente no requieren atencin mdica (infrmelos a su mdico o a Barrister's clerk de la salud si persisten o si son molestos): diarrea dolor de cabeza nuseas, vmito dolor estomacal Puede ser que esta lista no menciona todos los posibles efectos secundarios. Comunquese a su mdico por asesoramiento mdico [employer] Usted puede informar los efectos secundarios a la FDA por telfono al 1-800-FDA-1088. Dnde debo guardar mi medicina? Este medicamento se administra en hospitales o clnicas y no necesitar guardarlo en su domicilio. ATENCIN: Este folleto es un resumen. Puede ser que no cubra toda la posible informacin. Si usted tiene Engineer, drilling  de [employer], consulte con su mdico, su farmacutico o su profesional de [employer]	[place].  2021 Elsevier/Gold Standard (2016-09-19 00:00:00)

## 2020-08-13 NOTE — Progress Notes (Signed)
Patient received IV Feraheme as ordered by Durene Fruits NP. Observed for at least 30 minutes post infusion.Tolerated well, vitals stable, discharge instructions given, verbalized understanding. Patient alert, oriented and ambulatory at the time of discharge.

## 2020-08-17 ENCOUNTER — Ambulatory Visit: Payer: Self-pay

## 2020-08-17 ENCOUNTER — Other Ambulatory Visit: Payer: Self-pay

## 2020-09-16 ENCOUNTER — Encounter: Payer: Self-pay | Admitting: General Practice

## 2020-12-08 IMAGING — MR MR PELVIS WO/W CM
29 series · 48 of 48 positions shown · IV contrast (gadavist)
Comparison: None.

CLINICAL DATA: Uterine fibroids

EXAM:
MRI PELVIS WITHOUT AND WITH CONTRAST
TECHNIQUE: Multiplanar multisequence MR imaging of the pelvis was performed
both before and after administration of intravenous contrast.
CONTRAST:  9mL GADAVIST GADOBUTROL 1 MMOL/ML IV SOLN

[Series 2: T2 · coronal · 5.0mm · 1.09mm/px · 2 of 38 slices shown (1 of 5)]
[im 1/38]
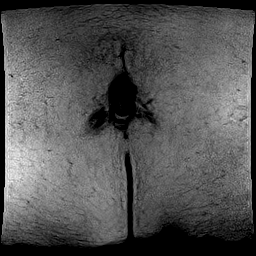
[im 38/38]
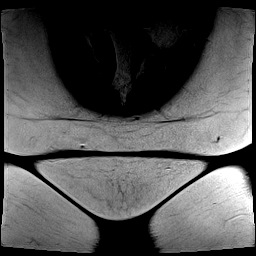

[Series 3: T2 · sagittal · 3.0mm · 1.09mm/px · 2 of 40 slices shown (2 of 5)]
[im 1/40]
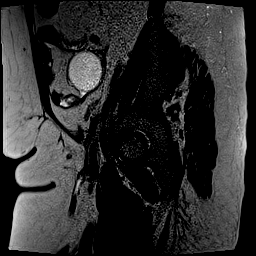
[im 40/40]
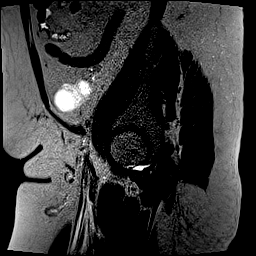

[Series 4: T2 fat-sat · axial · 4.5mm · 0.62mm/px · z∈[-87,+91]mm · 2 of 34 slices shown]
[im 1/34]
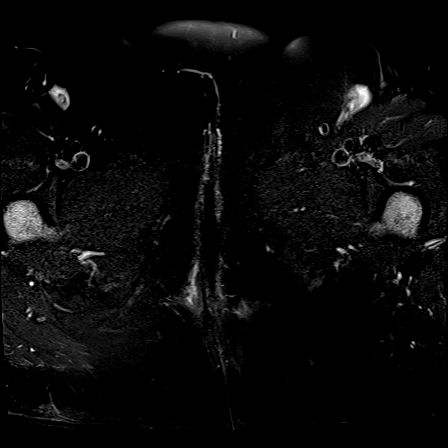
[im 34/34]
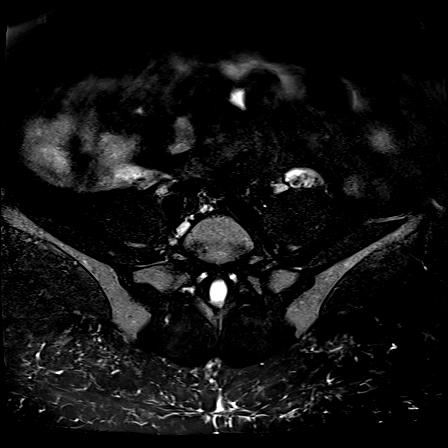

[Series 5: T2 · axial · 4.5mm · 0.62mm/px · z∈[-87,+91]mm · 2 of 34 slices shown (3 of 5)]
[im 1/34]
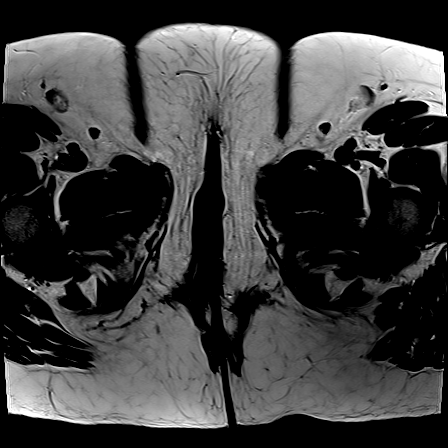
[im 34/34]
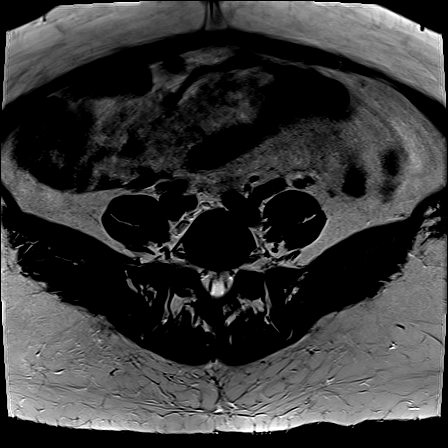

[Series 6: ax in + · axial · 6.0mm · 0.75mm/px · z∈[-102,+107]mm · 2 of 30 slices shown (1 of 2)]
[im 1/30]
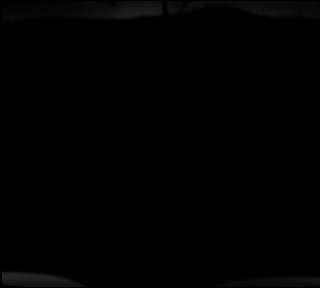
[im 30/30]
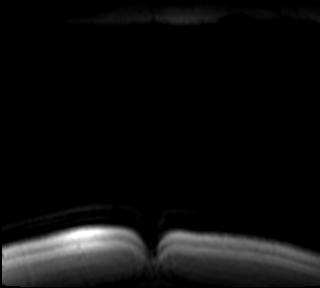

[Series 6: ax in + · axial · 6.0mm · 0.75mm/px · 1 of 30 slices shown (2 of 2)]
[im 1/30]
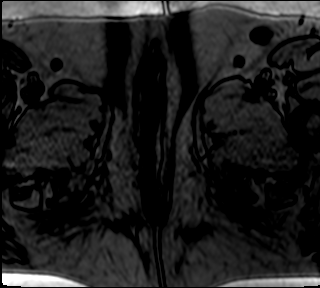

[Series 9: T2 · coronal · 4.5mm · 0.62mm/px · 1 of 34 slices shown (4 of 5)]
[im 1/34]
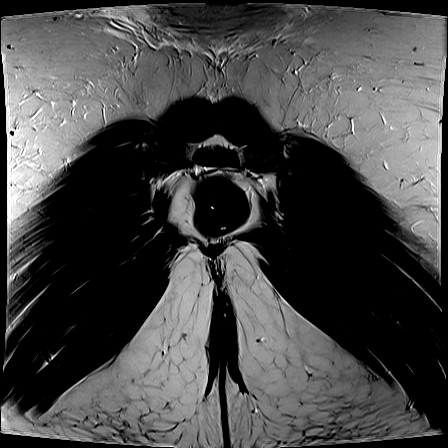

[Series 10: T1 dynamic · axial · 3.0mm · 0.94mm/px · z∈[-92,+97]mm · 2 of 64 slices shown (1 of 19)]
[im 1/64]
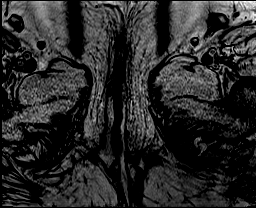
[im 64/64]
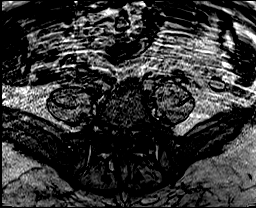

[Series 10: T1 dynamic · axial · 3.0mm · 0.94mm/px · z∈[-92,+97]mm · 2 of 64 slices shown (2 of 19)]
[im 1/64]
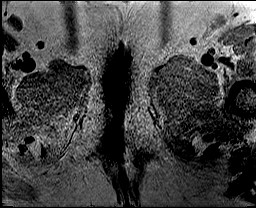
[im 64/64]
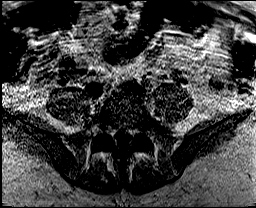

[Series 21: T1 dynamic · sagittal · 4.0mm · 0.97mm/px · 1 of 48 slices shown (3 of 19)]
[im 1/48]
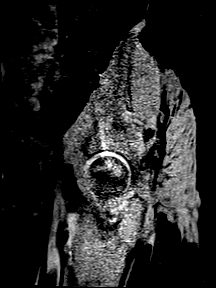

[Series 22: T1 dynamic · sagittal · 4.0mm · 0.97mm/px · 1 of 48 slices shown (4 of 19)]
[im 1/48]
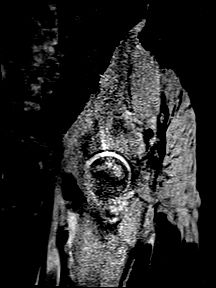

[Series 23: T1 dynamic · sagittal · 4.0mm · 0.97mm/px · 1 of 48 slices shown (5 of 19)]
[im 1/48]
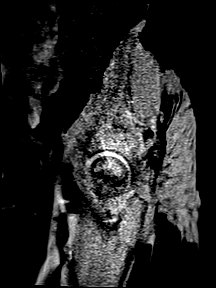

[Series 24: T1 dynamic · sagittal · 4.0mm · 0.97mm/px · 1 of 48 slices shown (6 of 19)]
[im 1/48]
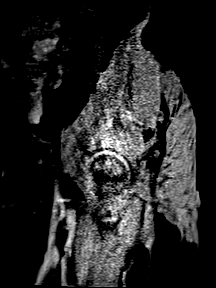

[Series 25: T1 dynamic · sagittal · 4.0mm · 0.97mm/px · 1 of 48 slices shown (7 of 19)]
[im 1/48]
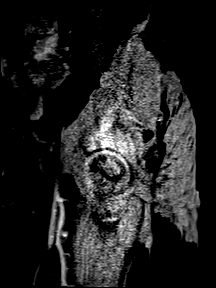

[Series 26: T1 dynamic · sagittal · 3.0mm · 0.97mm/px · 2 of 72 slices shown (8 of 19)]
[im 1/72]
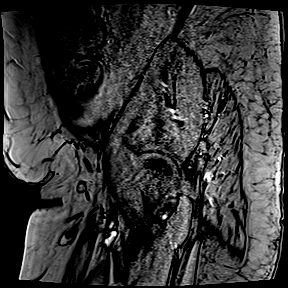
[im 72/72]
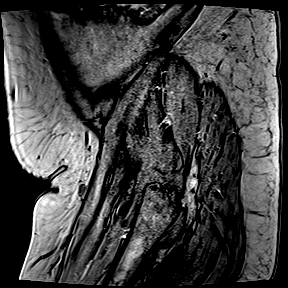

[Series 27: T1 dynamic · sagittal · 3.0mm · 0.97mm/px · 2 of 72 slices shown (9 of 19)]
[im 1/72]
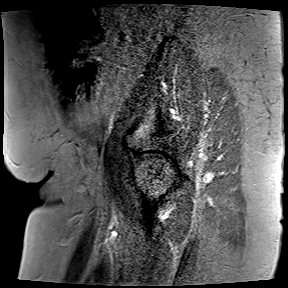
[im 72/72]
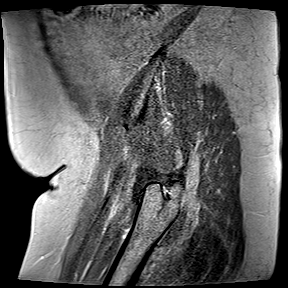

[Series 28: T1 dynamic · sagittal · 3.0mm · 0.97mm/px · 2 of 72 slices shown (10 of 19)]
[im 1/72]
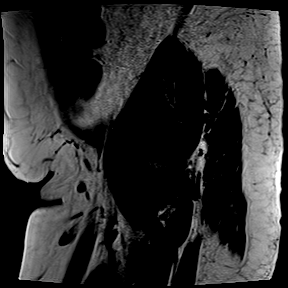
[im 72/72]
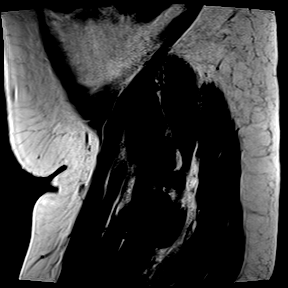

[Series 29: T1 dynamic · sagittal · 3.0mm · 0.97mm/px · 2 of 72 slices shown (11 of 19)]
[im 1/72]
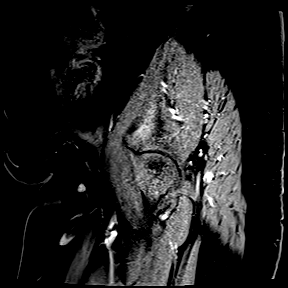
[im 72/72]
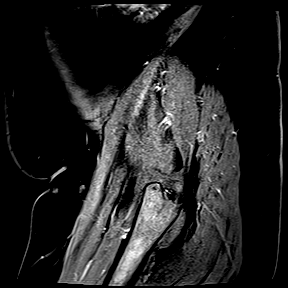

[Series 30: T1 dynamic · sagittal · 3.0mm · 0.97mm/px · 2 of 72 slices shown (12 of 19)]
[im 1/72]
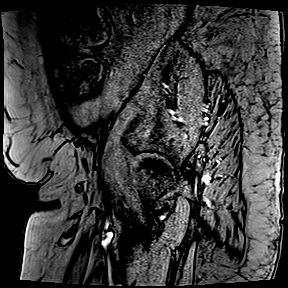
[im 72/72]
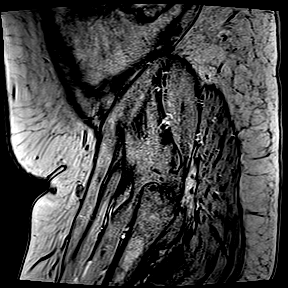

[Series 31: T1 dynamic · sagittal · 3.0mm · 0.97mm/px · 2 of 72 slices shown (13 of 19)]
[im 1/72]
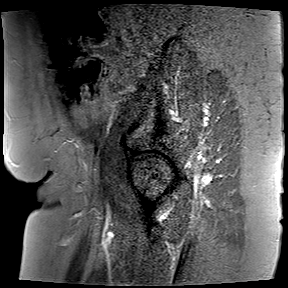
[im 72/72]
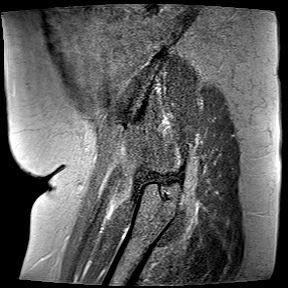

[Series 32: T1 dynamic · sagittal · 3.0mm · 0.97mm/px · 2 of 72 slices shown (14 of 19)]
[im 1/72]
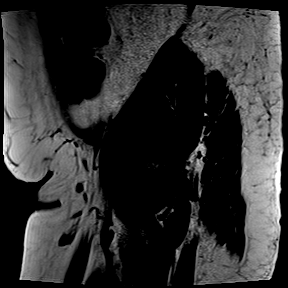
[im 72/72]
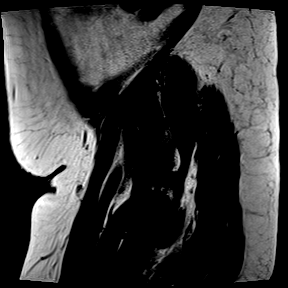

[Series 33: T1 dynamic · sagittal · 3.0mm · 0.97mm/px · 2 of 72 slices shown (15 of 19)]
[im 1/72]
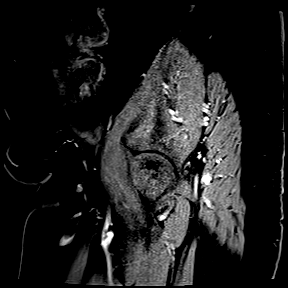
[im 72/72]
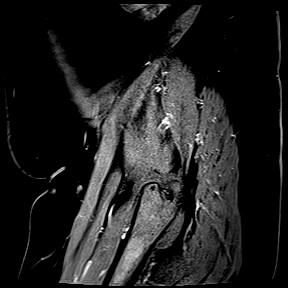

[Series 34: T1 dynamic · sagittal · 3.0mm · 0.97mm/px · 2 of 72 slices shown (16 of 19)]
[im 1/72]
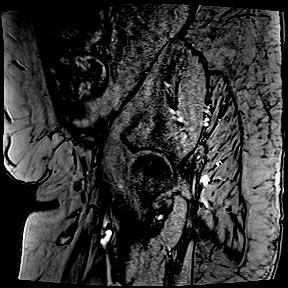
[im 72/72]
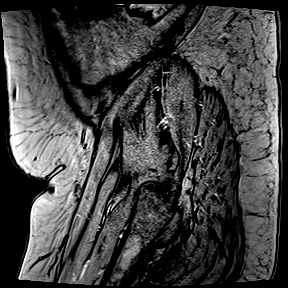

[Series 35: T1 dynamic · sagittal · 3.0mm · 0.97mm/px · 2 of 72 slices shown (17 of 19)]
[im 1/72]
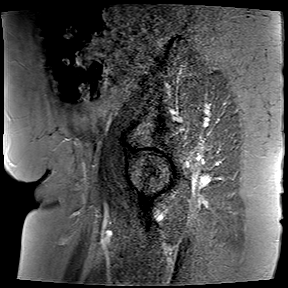
[im 72/72]
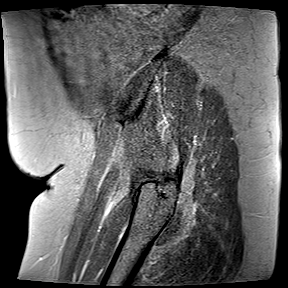

[Series 36: T1 dynamic · sagittal · 3.0mm · 0.97mm/px · 2 of 72 slices shown (18 of 19)]
[im 1/72]
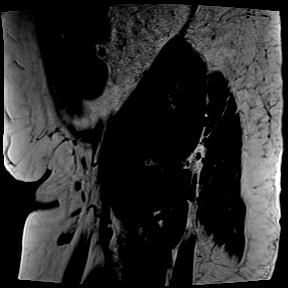
[im 72/72]
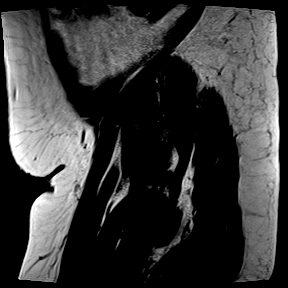

[Series 37: T1 dynamic · sagittal · 3.0mm · 0.97mm/px · 2 of 72 slices shown (19 of 19)]
[im 1/72]
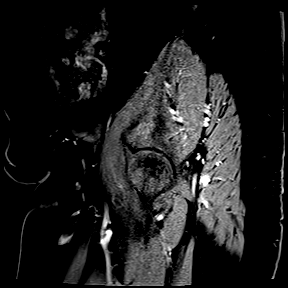
[im 72/72]
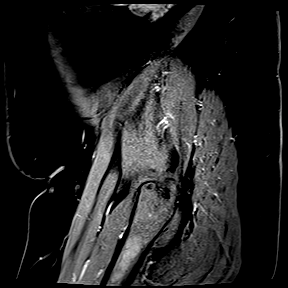

[Series 40: T1 fat-sat post-contrast · coronal · 5.0mm · 1.09mm/px · 1 of 32 slices shown (1 of 2)]
[im 1/32]
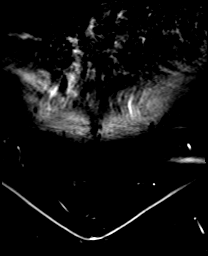

[Series 41: T1 fat-sat post-contrast · axial · 6.0mm · 1.19mm/px · 1 of 30 slices shown (2 of 2)]
[im 1/30]
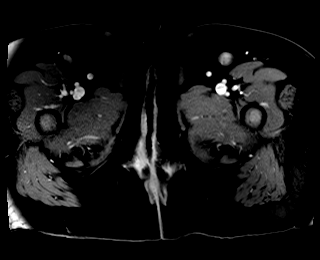

[Series 42: T2 · sagittal · 4.5mm · 0.62mm/px · 1 of 25 slices shown (5 of 5)]
[im 1/25]
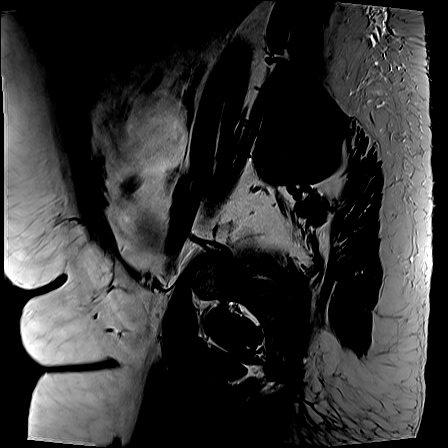

[48 of 48 positions shown; findings below may reference images not displayed]

FINDINGS: Urinary Tract:  No abnormality visualized.

Bowel:  Unremarkable visualized pelvic bowel loops.

Vascular/Lymphatic: No pathologically enlarged lymph nodes. No
significant vascular abnormality seen.

Reproductive: Multiple simple bilateral ovarian cysts and follicles.
Multiple uterine fibroids throughout the uterine body and fundus,
several with submucosal components, the largest of the posterior
uterine body with a significant submucosal component, the exact
margins difficult to distinctly appreciate but measuring
approximately 5.1 x 3.6 x 3.5 cm (series 3, image 20). Fluid in the
endometrial cavity and endocervical canal. Multiple small nabothian
cysts. CIS area in section scar of the anterior lower uterine
segment.

Other:  None.

Musculoskeletal: No suspicious bone lesions identified.
IMPRESSION: 1. Multiple uterine fibroids throughout the uterine body and fundus,
several with submucosal components, the largest of which is in the
posterior uterine body with a significant submucosal component, the
exact margins difficult to distinctly appreciate but measuring
approximately 5.1 x 3.6 x 3.5 cm.
2. Fluid in the endometrial cavity and endocervical canal, likely
functional.
3. Functional cysts and follicles of the ovaries.

## 2021-11-23 ENCOUNTER — Other Ambulatory Visit: Payer: Self-pay | Admitting: Obstetrics and Gynecology

## 2021-11-23 DIAGNOSIS — Z1231 Encounter for screening mammogram for malignant neoplasm of breast: Secondary | ICD-10-CM

## 2022-02-02 ENCOUNTER — Ambulatory Visit: Payer: Self-pay

## 2022-02-02 ENCOUNTER — Ambulatory Visit: Payer: Self-pay | Admitting: *Deleted

## 2022-02-02 ENCOUNTER — Other Ambulatory Visit: Payer: Self-pay

## 2022-02-02 VITALS — BP 140/82 | Wt 240.7 lb

## 2022-02-02 DIAGNOSIS — Z1211 Encounter for screening for malignant neoplasm of colon: Secondary | ICD-10-CM

## 2022-02-02 DIAGNOSIS — Z1239 Encounter for other screening for malignant neoplasm of breast: Secondary | ICD-10-CM

## 2022-02-02 DIAGNOSIS — R928 Other abnormal and inconclusive findings on diagnostic imaging of breast: Secondary | ICD-10-CM

## 2022-02-02 NOTE — Progress Notes (Signed)
Deborah Higgins is a 46 y.o. female who presents to [facility] clinic today with no complaints. Patient had a screening mammogram completed 06/15/2020 that additional imaging of the left breast was recommended for follow up for possible asymmetry and not completed.   Pap Smear: Pap smear not completed today. Last Pap smear was 05/28/2017 at the [facility] and was normal with negative HPV. Per patient has no history of an abnormal Pap smear. Last Pap smear result is available in Epic.   Physical exam: Breasts Breasts symmetrical. No skin abnormalities bilateral breasts. No nipple retraction bilateral breasts. No nipple discharge bilateral breasts. No lymphadenopathy. No lumps palpated bilateral breasts. No complaints of pain or tenderness on exam.     MS DIGITAL SCREENING TOMO BILATERAL  Result Date: 06/16/2020 CLINICAL DATA:  Screening. EXAM: DIGITAL SCREENING BILATERAL MAMMOGRAM WITH TOMO AND CAD COMPARISON:  None. ACR Breast Density Category b: There are scattered areas of fibroglandular density. FINDINGS: In the left breast, a possible asymmetry warrants further evaluation. In the right breast, no findings suspicious for malignancy. Images were processed with CAD. IMPRESSION: Further evaluation is suggested for possible asymmetry in the left breast. RECOMMENDATION: Diagnostic mammogram and possibly ultrasound of the left breast. (Code:[id number]) The patient will be contacted regarding the findings, and additional imaging will be scheduled. BI-RADS CATEGORY  0: Incomplete. Need additional imaging evaluation and/or prior mammograms for comparison. Electronically Signed   By: Kristopher Oppenheim M.D.   On: 06/16/2020 08:45    Pelvic/Bimanual Pap is not indicated today per BCCCP guidelines.    Smoking History: Patient has never smoked.   Patient Navigation: Patient education provided. Access to services provided for patient through [place] program. Spanish  interpreter Rudene Anda from [facility] provided. Patient has food insecurities. Patient escorted to the food market at the [facility] for groceries.  Colorectal Cancer Screening: Per patient has never had colonoscopy completed. FIT Test given to patient to complete. No complaints today.    Breast and Cervical Cancer Risk Assessment: Patient does not have family history of breast cancer, known genetic mutations, or radiation treatment to the chest before age 10. Patient does not have history of cervical dysplasia, immunocompromised, or DES exposure in-utero.  Risk Assessment     Risk Scores       02/02/2022   Last edited by: Royston Bake, CMA   5-year risk: 0.4 %   Lifetime risk: 4.3 %            A: BCCCP exam without pap smear No complaints.  P: Referred patient to the [facility] for a diagnostic mammogram per recommendation. Appointment scheduled Thursday, March 16, 2022 at 1240.  Loletta Parish, RN 02/02/2022 1:23 PM

## 2022-02-02 NOTE — Patient Instructions (Signed)
Explained breast self awareness with Dia Sitter Moreno-Flores. Patient did not need a Pap smear today due to last Pap smear and HPV typing was 05/28/2017. Let her know BCCCP will cover Pap smears and HPV typing every 5 years unless has a history of abnormal Pap smears. Referred patient to the [facility] for a diagnostic mammogram per recommendation. Appointment scheduled Thursday, March 16, 2022 at 1240. Patient aware of appointment and will be there. Dia Sitter Moreno-Flores verbalized understanding.  Emer Onnen, Arvil Chaco, RN 1:23 PM

## 2022-02-20 ENCOUNTER — Inpatient Hospital Stay: Payer: Self-pay | Attending: Obstetrics and Gynecology | Admitting: *Deleted

## 2022-02-20 ENCOUNTER — Other Ambulatory Visit: Payer: Self-pay

## 2022-02-20 VITALS — BP 122/74 | Ht 62.0 in | Wt 242.4 lb

## 2022-02-20 DIAGNOSIS — Z0000 Encounter for general adult medical examination without abnormal findings: Secondary | ICD-10-CM

## 2022-02-20 NOTE — Progress Notes (Signed)
[facility] initial [place], [employer]   Clinical Measurement: There were no vitals filed for this visit. Fasting Labs Drawn Today, will review with patient when they result.   Medical History:  Patient states that she does not have high cholesterol, does not have high blood pressure and she does not have diabetes.  Medications:  Patient states that she does not take medication to lower cholesterol, blood pressure or blood sugar.  Patient does not take an aspirin a day to help prevent a heart attack or stroke.   Blood pressure, self measurement:  :  Patient states that she does not measure blood pressure from home. She checks her blood pressure N/A. She shares her readings with a health care provider: N/A.   Nutrition: Patient states that on average she eats 1 cups of fruit and 0 cups of vegetables per day. Patient states that she does not eat fish at least 2 times per week. Patient eats about half servings of whole grains. Patient drinks less than 36 ounces of beverages with added sugar weekly: yes. Patient is currently watching sodium or salt intake: yes. In the past 7 days patient has consumed drinks containing alcohol on 0 days. On a day that patient consumes drinks containing alcohol on average 0 drinks are consumed.      Physical activity:  Patient states that she gets 0 minutes of moderate and 0 minutes of vigorous physical activity each week.  Smoking status:  Patient states that she has has never smoked .   Quality of life:  Over the past 2 weeks patient states that she had little interest or pleasure in doing things: several days. She has been feeling down, depressed or hopeless:several days.    Risk reduction and counseling:   Health Coaching: Spoke with patient in detail about diet. Explained to patient that the daily recommendation for fruits is 2 cups and vegetables are 3 cups. Showed patient what a serving size would look like. Patient consumes fish  once a month. Explained to patient about adding fish into diet for heart health. Gave suggestions for salmon, tuna, mackerel, sardines or sea bass. Patient consumes a variety of whole grains but not always regularly. Tried to encourage patient to add in more whole grains regularly. Patient currently does not do any exercising. Encouraged patient to try and start exercising a few days a week for a short time and to increase over time. See goal for exercise below.   Goal: Start exercising more. Patient will start with walking 2-3 days per week for 10-15 minutes. Patient will work on reaching this goal over the next 3 months. Patient will increase to 5 days a week for 20-30 minutes once first goal is reached.   Navigation:  I will notify patient of lab results.  Patient is aware of 2 more health coaching sessions and a follow up. [place] referrals made.   Time: 25 minutes

## 2022-02-21 LAB — HEMOGLOBIN A1C
Est. average glucose Bld gHb Est-mCnc: 114 mg/dL
Hgb A1c MFr Bld: 5.6 % (ref 4.8–5.6)

## 2022-02-21 LAB — LIPID PANEL
Chol/HDL Ratio: 3.8 ratio (ref 0.0–4.4)
Cholesterol, Total: 105 mg/dL (ref 100–199)
HDL: 28 mg/dL — ABNORMAL LOW (ref >39)
LDL Chol Calc (NIH): 54 mg/dL (ref 0–99)
Triglycerides: 128 mg/dL (ref 0–149)
VLDL Cholesterol Cal: 23 mg/dL (ref 5–40)

## 2022-02-21 LAB — GLUCOSE, RANDOM: Glucose: 105 mg/dL — ABNORMAL HIGH (ref 70–99)

## 2022-02-27 ENCOUNTER — Telehealth: Payer: Self-pay

## 2022-02-27 NOTE — Telephone Encounter (Signed)
Health coaching 2   interpreter-Erika Lovette Cliche   Labs- 105 cholesterol, 54 LDL cholesterol, 128 triglycerides, 28 HDL cholesterol, 5.6 hemoglobin A1C, 105 mean plasma glucose.  Patient understands and is aware of her lab results.   Goals-  1. Reduce sweets and sugars consumed in both food and drinks.  2. Reduce the amount of carbs consumed. 3. Increase healthy fat intake. 4. Start exercising daily for 20-30 minutes.   Navigation:  Patient is aware of 1 more health coaching sessions and a follow up. Will call patient with FU information for Internal Medicine once appointment is scheduled for elevated labs.  Time-  20 minutes

## 2022-03-16 ENCOUNTER — Ambulatory Visit
Admission: RE | Admit: 2022-03-16 | Discharge: 2022-03-16 | Disposition: A | Discharge status: Home / Self Care | Payer: No Typology Code available for payment source | Source: Ambulatory Visit | Attending: Obstetrics and Gynecology | Admitting: Obstetrics and Gynecology

## 2022-03-16 ENCOUNTER — Ambulatory Visit
Admission: RE | Admit: 2022-03-16 | Discharge: 2022-03-16 | Disposition: A | Discharge status: Home / Self Care | Payer: Self-pay | Source: Ambulatory Visit | Attending: Obstetrics and Gynecology | Admitting: Obstetrics and Gynecology

## 2022-03-16 DIAGNOSIS — R928 Other abnormal and inconclusive findings on diagnostic imaging of breast: Secondary | ICD-10-CM

## 2022-03-24 ENCOUNTER — Ambulatory Visit (INDEPENDENT_AMBULATORY_CARE_PROVIDER_SITE_OTHER): Payer: Self-pay | Admitting: Student

## 2022-03-24 ENCOUNTER — Encounter: Payer: Self-pay | Admitting: Student

## 2022-03-24 ENCOUNTER — Encounter (HOSPITAL_COMMUNITY): Payer: Self-pay

## 2022-03-24 ENCOUNTER — Telehealth: Payer: Self-pay | Admitting: *Deleted

## 2022-03-24 ENCOUNTER — Other Ambulatory Visit (HOSPITAL_COMMUNITY): Payer: Self-pay

## 2022-03-24 ENCOUNTER — Telehealth: Payer: Self-pay | Admitting: Student

## 2022-03-24 VITALS — BP 127/67 | HR 83 | Temp 98.0°F | Wt 239.1 lb

## 2022-03-24 DIAGNOSIS — M79646 Pain in unspecified finger(s): Secondary | ICD-10-CM | POA: Insufficient documentation

## 2022-03-24 DIAGNOSIS — N920 Excessive and frequent menstruation with regular cycle: Secondary | ICD-10-CM | POA: Insufficient documentation

## 2022-03-24 DIAGNOSIS — M79644 Pain in right finger(s): Secondary | ICD-10-CM

## 2022-03-24 DIAGNOSIS — C642 Malignant neoplasm of left kidney, except renal pelvis: Secondary | ICD-10-CM

## 2022-03-24 DIAGNOSIS — D170 Benign lipomatous neoplasm of skin and subcutaneous tissue of head, face and neck: Secondary | ICD-10-CM

## 2022-03-24 DIAGNOSIS — K5900 Constipation, unspecified: Secondary | ICD-10-CM | POA: Insufficient documentation

## 2022-03-24 DIAGNOSIS — E66813 Obesity, class 3: Secondary | ICD-10-CM | POA: Insufficient documentation

## 2022-03-24 DIAGNOSIS — Z0000 Encounter for general adult medical examination without abnormal findings: Secondary | ICD-10-CM | POA: Insufficient documentation

## 2022-03-24 DIAGNOSIS — D500 Iron deficiency anemia secondary to blood loss (chronic): Secondary | ICD-10-CM

## 2022-03-24 DIAGNOSIS — R109 Unspecified abdominal pain: Secondary | ICD-10-CM | POA: Insufficient documentation

## 2022-03-24 DIAGNOSIS — M79645 Pain in left finger(s): Secondary | ICD-10-CM

## 2022-03-24 DIAGNOSIS — K219 Gastro-esophageal reflux disease without esophagitis: Secondary | ICD-10-CM

## 2022-03-24 LAB — CBC WITH DIFFERENTIAL/PLATELET
Abs Immature Granulocytes: 0.03 10*3/uL (ref 0.00–0.07)
Basophils Absolute: 0 10*3/uL (ref 0.0–0.1)
Basophils Relative: 1 %
Eosinophils Absolute: 0.3 10*3/uL (ref 0.0–0.5)
Eosinophils Relative: 3 %
HCT: 23.5 % — ABNORMAL LOW (ref 36.0–46.0)
Hemoglobin: 6.2 g/dL — CL (ref 12.0–15.0)
Immature Granulocytes: 0 %
Lymphocytes Relative: 24 %
Lymphs Abs: 1.8 10*3/uL (ref 0.7–4.0)
MCH: 15.2 pg — ABNORMAL LOW (ref 26.0–34.0)
MCHC: 26.4 g/dL — ABNORMAL LOW (ref 30.0–36.0)
MCV: 57.5 fL — ABNORMAL LOW (ref 80.0–100.0)
Monocytes Absolute: 0.4 10*3/uL (ref 0.1–1.0)
Monocytes Relative: 5 %
Neutro Abs: 5.1 10*3/uL (ref 1.7–7.7)
Neutrophils Relative %: 67 %
Platelets: 518 10*3/uL — ABNORMAL HIGH (ref 150–400)
RBC: 4.09 MIL/uL (ref 3.87–5.11)
RDW: 20.3 % — ABNORMAL HIGH (ref 11.5–15.5)
WBC: 7.6 10*3/uL (ref 4.0–10.5)
nRBC: 0 % (ref 0.0–0.2)

## 2022-03-24 LAB — IRON AND TIBC
Iron: 13 ug/dL — ABNORMAL LOW (ref 28–170)
Saturation Ratios: 3 % — ABNORMAL LOW (ref 10.4–31.8)
TIBC: 507 ug/dL — ABNORMAL HIGH (ref 250–450)
UIBC: 494 ug/dL

## 2022-03-24 LAB — FERRITIN: Ferritin: 3 ng/mL — ABNORMAL LOW (ref 11–307)

## 2022-03-24 MED ORDER — POLYETHYLENE GLYCOL 3350 17 G PO PACK
17.0000 g | PACK | Freq: Every day | ORAL | 0 refills | Status: AC
  Filled 2022-03-24: qty 14, 14d supply, fill #0

## 2022-03-24 MED ORDER — OMEPRAZOLE 20 MG PO CPDR
20.0000 mg | DELAYED_RELEASE_CAPSULE | Freq: Every day | ORAL | 11 refills | Status: AC
  Filled 2022-03-24: qty 30, 30d supply, fill #0

## 2022-03-24 NOTE — Assessment & Plan Note (Signed)
Screening mammogram on 06/15/2020 showed asymmetry in left breast.  Diagnostic mammogram with ultrasound left breast on 03/16/2022 with stable asymmetry of the outer midportion of the left breast likely representing asymmetric fibroglandular tissue and an incidental 6 mm cyst over the 3 o'clock position of the left breast.  Recommendation 1 additional follow-up diagnostic bilateral mammogram in 03/2023.  BI-RADS 3 probably benign.   Labs on 02/20/2022: Lipid panel showed total cholesterol 105, HDL 28, LDL 54.  Hemoglobin A1c of 5.6 with random glucose of 105. Labs from 07/21/2020: CMP, hemoglobin A1c, TSH, hepatitis C virus antibody negative or within normal limits.  CBC with hemoglobin of 10.7, MCV of 78, with an iron panel that showed iron level of 19 and saturation of 5.  FIT test has been ordered and she has at her house but has not done it.  Patient instructed to quickly return it to the lab whenever she does use it.

## 2022-03-24 NOTE — Assessment & Plan Note (Signed)
Right at the end of our visit the patient pointed out a lump on the left side of her neck just posterior to the SCM.  States that she has been told it is a lipoma in the past.  Exam consistent with lipoma.  Denies tenderness and states it has been there for many years.  - May discuss this at next visit

## 2022-03-24 NOTE — Assessment & Plan Note (Addendum)
Patient is 5 to 6 months of stable pain in the third fingers on both hands.  She notes associated cramps at night and some possible trigger finger like episodes.  No associated radiculopathy or muscle weakness.  Unfortunately this was brought up to the end of her visit and will need to be further evaluated at her next visit.

## 2022-03-24 NOTE — Telephone Encounter (Signed)
Call from [place] at the lab with Hgb result of 6.2. Pt saw Dr Nikki Dom this morning.

## 2022-03-24 NOTE — Assessment & Plan Note (Addendum)
As needed review patient has had iron deficiency anemia secondary to chronic blood loss with requirements for blood transfusions and iron transfusions.  Today patient does complain of fatigue and continues to have heavy menstrual periods but states that her fatigue is not as bad as it had been when she went to the hospital before.  She has not seen a GYN about intervention for her uterine fibroids he had.  She unfortunately does not have insurance coverage into avoid any high cost we will try to hold off on referral to specialist at this point.  This decision was made after shared decision-making with the patient.  Reportedly the patient has not been taking her iron supplements. - CBC and iron panel today  Update after visit with results showing severe anemia with hemoglobin of 6.2 and low iron consistent with prior labs.  Patient was called and decision was made for direct admission to the hospital.  Please see separate note for further details.

## 2022-03-24 NOTE — Telephone Encounter (Signed)
Tomorrow is fine; this is a slow chronic bleed. Still needs admission but does not need to go urgently to the ED. Thank you.

## 2022-03-24 NOTE — Telephone Encounter (Signed)
Thanks Glenda. Dr. Nikki Dom is aware and arranging for admission to treat the anemia.

## 2022-03-24 NOTE — Assessment & Plan Note (Signed)
Patient notes intermittent constipation and diarrhea with the constipation being the most bothersome part.  States that these episodes do not last more than 2 days either way.  Denies any change in color, dark tarry, or bloody stools. - MiraLAX daily as needed

## 2022-03-24 NOTE — Progress Notes (Signed)
   CC: Establish care  HPI:  Ms.Aften Kymoni Lesperance is a 46 y.o. female with history as below who presents as a new patient referred from the [facility] program for hyperglycemia.  Please see encounters tab for problem-based charting  Past Medical History:  Diagnosis Date   Anemia due to chronic blood loss    Complication of anesthesia    a lot of rashes on skin when had C-section   Menorrhagia    Renal cell carcinoma of left kidney (HCC)    Review of Systems:   A comprehensive review of systems was negative except for: Constitutional: positive for fatigue Gastrointestinal: positive for abdominal pain, constipation, diarrhea, and dyspepsia Genitourinary: positive for heavy menstrual periods Musculoskeletal: positive for finger joint pain   Physical Exam:  Vitals:   03/24/22 1106  BP: 127/67  Pulse: 83  Temp: 98 F (36.7 C)  TempSrc: Oral  SpO2: 100%  Weight: 239 lb 1.6 oz (108.5 kg)   Constitutional: Obese female sitting in a chair. In no acute distress. HENT: Normocephalic, atraumatic,  Eyes: Sclera non-icteric, PERRL, EOM intact Neck: Soft circular 4 cm mobile mass on the left side of her neck just posterior to the SCM, consistent with lipoma Cardio:Regular rate and rhythm. No murmurs, rubs, or gallops. 2+ bilateral radial and dorsalis pedis  pulses. Pulm:Clear to auscultation bilaterally. Normal work of breathing on room air. Abdomen: Soft, non-tender, non-distended, positive bowel sounds. BDZ:HGDJMEQA for extremity edema. Skin:Warm and dry. Neuro:Alert and oriented x3. No focal deficit noted. Psych:Pleasant mood and affect.   Assessment & Plan:   See Encounters Tab for problem based charting.  Patient seen with Dr. Philipp Ovens

## 2022-03-24 NOTE — Patient Instructions (Addendum)
Deborah Higgins, por permitirnos brindarle su atencin hoy. Hoy discutimos. . .  > Anemia por deficiencia de hierro y perodos menstruales abundantes        - Hoy comprobaremos sus niveles en sangre y sus niveles de hierro. Pensamos que la causa de su sangrado se debe a los fibromas uterinos. Nos gustara enviarlo a un especialista, pero tendremos que esperar hasta que tenga seguro. Continuaremos trabajando en esto con usted y haremos que nuestro trabajador social lo llame para ayudarlo con los formularios. > ERGE        - Le comenzaremos con omeprazol 20 mg al da para el dolor por reflujo que ha Deborah Higgins teniendo.  Intente tomar tylenol 650 mg cada 6 horas para el dolor en los dedos.  He ordenado los siguientes laboratorios para usted:  rdenes de laboratorio       CBC con diferencia       Panel de [place], [place] y [place]     Pruebas ordenadas hoy:  Ninguno   Referencias ordenadas hoy:  rdenes de referencia No se solicitaron referencias hoy     He ordenado/cambiado los siguientes medicamentos:  Suspenda los siguientes medicamentos: No hay medicamentos discontinuados.  Inicie los siguientes medicamentos: Los mdicos ordenaron este encuentro Medicamentos  polietilenglicol (MIRALAX / GLYCOLAX) paquete de 17 g Sig: Tomar 17 g por va oral al da. Dispensar: 14 cada uno Recarga: 0  tableta de omeprazol (PRILOSEC OTC) de 20 mg Sig: [place] 1 tableta (20 mg en total) por va oral al da. Dispensacin: 30 comprimidos Recarga: 11 Programa de mensajera instantnea     Seguimiento: 1 mes   Recordar:   Si tiene alguna pregunta o inquietud, llame a la clnica de medicina interna al [phone].   Deborah Higgins  Thank you, [name], for allowing [place] to provide your care today. Today we discussed . . .  > Iron Deficiency Anemia and Heavy Menstrual Periods       - We will check  your blood levels and iron levels today. We think that the cause of your bleeding is due to the uterine fibroids. We would like to send you to a specialist but will need to wait until you have insurance. We will continue to work on this with you and get our social worker to call you to help with the forms.  > GERD       - We will start you on omeprazole 20 mg daily for the reflux pain you have been having.   Try to take tylenol 650 mg every 6 hours for the pain in your fingers.   I have ordered the following labs for you:  Lab Orders         CBC with Diff         Iron, TIBC and Ferritin Panel        Tests ordered today:  None   Referrals ordered today:   Referral Orders  No referral(s) requested today      I have ordered the following medication/changed the following medications:   Stop the following medications: There are no discontinued medications.   Start the following medications: Meds ordered this encounter  Medications   polyethylene glycol (MIRALAX / GLYCOLAX) 17 g packet    Sig: Take 17 g by mouth daily.    Dispense:  14 each    Refill:  0   omeprazole (PRILOSEC OTC) 20 MG tablet  Sig: Take 1 tablet (20 mg total) by mouth daily.    Dispense:  30 tablet    Refill:  11    IM Program      Follow up:  1 Month     Remember:     Should you have any questions or concerns please call the internal medicine clinic at [phone].     Deborah Higgins, [place]

## 2022-03-24 NOTE — Telephone Encounter (Signed)
CBC drawn in office today showed a hemoglobin of 6.2 and patient with anemia secondary to chronic blood loss due to fibroids and heavy menstrual bleeding.  She has had a history of blood transfusions and iron transfusions in the last 2 to 3 years.  Decision for direct admit was made after discussing with Dr. Philipp Ovens.  Inpatient teams were notified and nursing staff has been in the process contact the hospital for direct admission.  I called the patient with details below.  Called the patient using a telephonic interpreting service in which the patient at about 2:45 PM.  Patient was informed of her low hemoglobin of 6.2 and its significance especially considering her past iron transfusions and blood transfusions due to chronic blood loss anemia secondary to heavy menstrual bleeding with fibroids.  We discussed our concern that she may continue to lose blood and that our recommendation is for her to be admitted to the hospital for observation and evaluation for intervention.  As we had discussed during her visit today she will need evaluation by OB/GYN and/for interventional radiology for procedural control of her bleeding.  She is still currently in her normal state of health with stable fatigue that has not worsened recently or since her office visit.  She was counseled on strict precautions for going to the ED if she has not gotten a bed for direct admit yet.  She states her understanding and all questions were answered.  [name], DO Internal Medicine Resident, PGY 1

## 2022-03-24 NOTE — Assessment & Plan Note (Signed)
As in the overview patient has a long history of heavy menstrual periods and evidence of fibroids on abdominal imaging in the past.  Patient has not seen an OB/GYN about further management of her fibroids.  She continues to have monthly cycles that are heavy.  She has been on Nexplanon for a few years in the past and states that her cycles were less heavy at that point.  Overall plan will be to refer to OB/GYN or IR for operative management of her fibroids that are causing significant blood loss.  However after shared decision making due to her current uninsured status we will hold off on referral at this point. - CBC and iron panel today - Hold off on referral to specialist until able to get insurance

## 2022-03-24 NOTE — Assessment & Plan Note (Signed)
Patient notes 1 month of nighttime coughing, burning retrosternal pain with large meals, low appetite and eating less.  Denies dysphagia, odynophagia, weight change.  Symptoms are consistent with GERD and she is not had any treatment for this. - Omeprazole 20 mg daily

## 2022-03-24 NOTE — Progress Notes (Signed)
Called the patient and discussed her lab results with focus on her hemoglobin of 6.2 and our decision for direct admission.  Please see the telephone note.  Iron is noted to still be low and she may require repeat iron infusions but ultimately needs uterine artery embolization or hysterectomy for control of her iron deficiency anemia.

## 2022-03-24 NOTE — Telephone Encounter (Signed)
Pt will be a direct admit for severe anemia per Dr Nikki Dom; bed placement called. Talked to Shirlean Mylar; she stated there are 20 people currently holding; a bed may not be available until tomorrow.

## 2022-03-24 NOTE — Assessment & Plan Note (Signed)
Patient notes intermittent central left abdominal pain associated with a bump near her midline abdominal surgical scar above her umbilicus.  She is worried that this is a hernia.  She denies any episodes consistent with incarceration or strangulation.  There is no prior abdominal imaging since her nephrectomy in 2019.  No appreciable hernia on exam. - We will continue to monitor and may require imaging in the future - Counseled on symptoms of hernia incarceration and strangulation to watch for.

## 2022-03-27 ENCOUNTER — Observation Stay (HOSPITAL_COMMUNITY)
Admission: RE | Admit: 2022-03-27 | Discharge: 2022-03-28 | Disposition: A | Discharge status: Home / Self Care | Payer: Self-pay | Source: Ambulatory Visit | Attending: Internal Medicine | Admitting: Internal Medicine

## 2022-03-27 ENCOUNTER — Telehealth: Payer: Self-pay | Admitting: Student

## 2022-03-27 ENCOUNTER — Observation Stay (HOSPITAL_COMMUNITY): Payer: No Typology Code available for payment source

## 2022-03-27 DIAGNOSIS — D649 Anemia, unspecified: Secondary | ICD-10-CM

## 2022-03-27 DIAGNOSIS — Z85528 Personal history of other malignant neoplasm of kidney: Secondary | ICD-10-CM | POA: Insufficient documentation

## 2022-03-27 DIAGNOSIS — Z905 Acquired absence of kidney: Secondary | ICD-10-CM | POA: Insufficient documentation

## 2022-03-27 DIAGNOSIS — N920 Excessive and frequent menstruation with regular cycle: Secondary | ICD-10-CM

## 2022-03-27 DIAGNOSIS — C642 Malignant neoplasm of left kidney, except renal pelvis: Secondary | ICD-10-CM | POA: Diagnosis present

## 2022-03-27 DIAGNOSIS — D259 Leiomyoma of uterus, unspecified: Secondary | ICD-10-CM | POA: Insufficient documentation

## 2022-03-27 DIAGNOSIS — D219 Benign neoplasm of connective and other soft tissue, unspecified: Secondary | ICD-10-CM

## 2022-03-27 DIAGNOSIS — D509 Iron deficiency anemia, unspecified: Principal | ICD-10-CM | POA: Insufficient documentation

## 2022-03-27 LAB — PROTIME-INR
INR: 1.1 (ref 0.8–1.2)
Prothrombin Time: 14.4 seconds (ref 11.4–15.2)

## 2022-03-27 LAB — GLUCOSE, CAPILLARY
Glucose-Capillary: 82 mg/dL (ref 70–99)
Glucose-Capillary: 122 mg/dL — ABNORMAL HIGH (ref 70–99)

## 2022-03-27 LAB — APTT: aPTT: 30 seconds (ref 24–36)

## 2022-03-27 LAB — PREPARE RBC (CROSSMATCH)

## 2022-03-27 MED ORDER — ACETAMINOPHEN 325 MG PO TABS: 650.0000 mg | ORAL_TABLET | Freq: Four times a day (QID) | ORAL | Status: DC | PRN

## 2022-03-27 MED ORDER — SODIUM CHLORIDE 0.9% IV SOLUTION: Freq: Once | INTRAVENOUS | Status: AC

## 2022-03-27 MED ORDER — NORETHINDRONE ACETATE 5 MG PO TABS
5.0000 mg | ORAL_TABLET | Freq: Every day | ORAL | Status: DC
  Administered 2022-03-27 – 2022-03-28 (×2): 5 mg via ORAL
  Filled 2022-03-27 (×2): qty 1

## 2022-03-27 MED ORDER — ACETAMINOPHEN 650 MG RE SUPP: 650.0000 mg | Freq: Four times a day (QID) | RECTAL | Status: DC | PRN

## 2022-03-27 MED ORDER — POLYETHYLENE GLYCOL 3350 17 G PO PACK: 17.0000 g | PACK | Freq: Every day | ORAL | Status: DC | PRN

## 2022-03-27 MED ORDER — SODIUM CHLORIDE 0.9 % IV SOLN
250.0000 mg | Freq: Every day | INTRAVENOUS | Status: DC
  Administered 2022-03-28 (×2): 250 mg via INTRAVENOUS
  Filled 2022-03-27 (×2): qty 20

## 2022-03-27 NOTE — Hospital Course (Addendum)
[name] is a 46 year old female with a PMHx of uterine fibroids, iron deficiency anemia, clear cell RCC s/p L nephrectomy, GERD who presents as a direct admit from home for symptomatic anemia.   Symptomatic anemia Uterine Fibroids  Her anemia and symptoms including fatigue and lightheadedness is likely caused by menorrhagia from her uterine fibroids. Hgb 6.2 at clinic. She was previously on megace but per patient report she had not been taking this medication. She denies any other recent forms of birth control. She had prior admission in 2021 for severe anemia with pelvic fibroids on MRI. Prior endometrial bx in 05/2020 negative for cancer. Also considered other causes of menorrhagia including coagulopathies or vWF deficiency. Patient denies family or individual history consistent with bleeding disorder, so lower suspicion at this time. PT/INR within normal limits. Given that she does not report hematochezia or hematemesis, also lower suspicion for active GI bleed at this time. She required 2u pRBCs during this admission. 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 OBGYN was consulted who recommended daily progesterone only pills and outpatient follow-up. Hgb at time of discharge was 7.4.    H/o iron deficiency anemia  Iron studies from most recent clinic visit consistent with iron deficiency anemia. She is currently intermittently taking PO iron. She was given IV iron and discharged on PO iron.   Social work needs  Patient is uninsured. TOC consulted and they will have financial counselor see her regarding financial assistance options to see specialists and for medication assistance.    Chronic:  GERD: home med omeprazole '20mg'$  daily

## 2022-03-27 NOTE — Consult Note (Signed)
OBSTETRICS AND GYNECOLOGY ATTENDING CONSULT NOTE  Consult Date: 03/27/2022  Reason for Consult: heavy menstrual bleeding  Consulting Provider: Dr. Nelda Marseille    Assessment/Plan: 1)Heavy menstrual bleeding 2) h/o uterine fibroids  -Pelvic US to be completed -Pt is not currently bleeding.  Plan to start progesterone only pills daily and recommend out patient follow up to see if this medication works to control her bleeding or to review alternative options -transfusion management per primary care team -no further gyn work up is indicated and will sign off at this time  Please call [phone] [facility] OB/GYN Consult Attending Monday-Friday 8am - 5pm) or [phone] [facility] OB/GYN Attending On Call all day, every day) for any gynecologic concerns at any time.  Thank you for involving [place] in the care of this patient.  Total consultation time including face-to-face time with patient (>50% of time), reviewing chart and documentation: 40 minutes  Janyth Pupa, DO Attending [place], [place] for [place] [place] [facility]   History of Present Illness: Deborah Higgins is an 46 y.o. [phone] female who was admitted for symptomatic anemia likely due to heavy menstrual bleeding.  It sounds as though she went to a clinic due to diarrhea and nausea.  Lab work was completed and noted to have a Hgb of 6.2   Spanish interpreter used for the entirety of the visit.  She notes long-standing history of HMB since 2001.  It seems as though she was prescribed Megace at some point, but has not been on it for quite some time.  Menses are regular each month- though there was some confusion as to whether she has intermenstrual bleeding or not.  After some confusion it seems as though her periods are each month for about 7 days.  Typically using about 5 pads per day with passage of clots  She also notes considerable dysmenorrhea.    It sounds as though  she also notes fatigue, headache and dizziness. Noted diarrhea/vomiting- that has since resolved.  +nausea, no vomiting. Denies fever/chills/CP/SOB.    Pertinent OB/GYN History: No LMP recorded. (Menstrual status: Irregular Periods). OB History  Gravida Para Term Preterm AB Living  '8 7 7 '$ 0 1 7  SAB IAB Ectopic Multiple Live Births  1 0 0 0 7    # Outcome Date GA Lbr Len/2nd Weight Sex Delivery Anes PTL Lv  8 SAB           7 Term      CS-LTranv     6 Term      Vag-Spont     5 Term      Vag-Spont     4 Term      Vag-Spont     3 Term      Vag-Spont     2 Term      Vag-Spont     1 Term      Vag-Spont       Patient Active Problem List   Diagnosis Date Noted   Symptomatic anemia 03/27/2022   Fibroid 03/27/2022   Class 3 obesity ([place]) 03/24/2022   Menorrhagia 03/24/2022   Gastroesophageal reflux disease 03/24/2022   Constipation 03/24/2022   Abdominal pain 03/24/2022   Healthcare maintenance 03/24/2022   Finger pain 03/24/2022   Lipoma of neck 03/24/2022   Renal cell carcinoma, left (HCC) 06/14/2017   Iron deficiency anemia 06/14/2017   Leukocytosis 06/14/2017   Thrombocytosis 06/14/2017    Past Medical History:  Diagnosis Date  Anemia due to chronic blood loss    Complication of anesthesia    a lot of rashes on skin when had C-section   Menorrhagia    Renal cell carcinoma of left kidney [facility])     Past Surgical History:  Procedure Laterality Date   CESAREAN SECTION  2007   ROBOT ASSISTED LAPAROSCOPIC NEPHRECTOMY Left 08/29/2017   Procedure: XI ROBOTIC ASSISTED LAPAROSCOPIC NEPHRECTOMY;  Surgeon: Alexis Frock, MD;  Location: [facility] ORS;  Service: Urology;  Laterality: Left;    Family History  Problem Relation Age of Onset   Uterine cancer Mother    Uterine cancer Maternal Aunt    Colon cancer Neg Hx     Social History:  reports that she has never smoked. She has never used smokeless tobacco. She reports that she does not drink alcohol and does not use  drugs.  Allergies: No Known Allergies  Medications: I have reviewed the patient's current medications.  Review of Systems: Pertinent items are noted in HPI.  Focused Physical Examination: Vitals sign pending CONSTITUTIONAL: Well-developed, well-nourished female in no acute distress.  HENT:  Normocephalic, atraumatic, External right and left ear normal. Oropharynx is clear and moist EYES: Conjunctivae and EOM are normal. Pupils are equal, round, and reactive to light. No scleral icterus.  NECK: Normal range of motion.  Left side ~ 3cm non-tender lipoma noted- per pt present for awhile. SKIN: Skin is warm and dry. No rash noted. Not diaphoretic. No erythema. No pallor. [place]: Alert and oriented to person, place, and time. Normal reflexes, muscle tone coordination.  PSYCHIATRIC: Normal mood and affect. Normal behavior. Normal judgment and thought content. CARDIOVASCULAR: Normal heart rate noted, regular rhythm RESPIRATORY: Clear to auscultation bilaterally. Effort and breath sounds normal, no problems with respiration noted. ABDOMEN: Obese, soft, normal bowel sounds, no distention noted.  No tenderness, rebound or guarding.  PELVIC: deferred MUSCULOSKELETAL: Normal range of motion. No tenderness.  No cyanosis, clubbing, or edema.  2+ distal pulses.  Labs and Imaging:    Latest Ref Rng & Units 03/24/2022   12:28 PM 07/21/2020   11:49 AM 05/02/2020    3:18 PM  CBC  WBC 4.0 - 10.5 K/uL 7.6  7.3  11.1   Hemoglobin 12.0 - 15.0 g/dL 6.2  10.7  7.6   Hematocrit 36.0 - 46.0 % 23.5  35.4  25.0   Platelets 150 - 400 K/uL 518  399  395    Records reviewed- seen in 04/2020- MRI- IMPRESSION: 1. Multiple uterine fibroids throughout the uterine body and fundus, several with submucosal components, the largest of which is in the posterior uterine body with a significant submucosal component, the exact margins difficult to distinctly appreciate but measuring approximately 5.1 x 3.6 x 3.5  cm. 2. Fluid in the endometrial cavity and endocervical canal, likely functional. 3. Functional cysts and follicles of the ovaries.   TVUS- pending

## 2022-03-27 NOTE — Progress Notes (Signed)
Internal Medicine Clinic Attending  I saw and evaluated the patient.  I personally confirmed the key portions of the history and exam documented by Dr. Nikki Dom and I reviewed pertinent patient test results.  The assessment, diagnosis, and plan were formulated together and I agree with the documentation in the resident's note.   Patient is still pending admission for her severe anemia. I will have Dr. Nikki Dom call today to follow up and check on bed status. May need to go to the ED if there continues to be a significant delay.

## 2022-03-27 NOTE — H&P (Cosign Needed)
Date: 03/27/2022               Patient Name:  Deborah Higgins [facility] MRN: [id number]  DOB: 11/07/1975 Age / Sex: 46 y.o., female   PCP: [name], DO         Medical Service: Internal Medicine Teaching Service         Attending Physician: Dr. Aldine Contes, MD    First Contact: Dr. Rolanda Lundborg Pager: [phone]  Second Contact: Dr. Gaylan Gerold Pager: [phone]       After Hours (After 5p/  First Contact Pager: [phone]  weekends / holidays): Second Contact Pager: [phone]   Chief Complaint: fatigue  History of Present Illness:  [name] is a 46 year old female with a PMHx of uterine fibroids, iron deficiency anemia, clear cell RCC s/p L nephrectomy, GERD who presents to the hospital as a direct admit from the clinic with Hgb of 6.2. She is Spanish-speaking. This visit was conducted with a video interpreter.  Patient reports her menstrual bleeding started last week around wed-thurs. She reports that she sometimes bleeds twice a month and has to use up to 10 pads a day. She reports her menstrual cycles last 5-10 days. They are more regular now and are associated with abd pain. Currently her bleeding has slowed down. She reports HA, nausea, lightheadedness, dizziness, and fatigue.   Patient reports she didn't know that she has fibroids. She doesn't know the cause of her menorrhagia. She reports she is currently not taking Megace or birth control. She reports the only medications she takes are vitamins. She may have had a Nexplanon 6-7 years ago. Her last pap smear was last yr.   She denies hematochezia, hematemesis. She reports constipation. Not sure about melena. Taking iron supplement intermittently, last dose 1 week ago. Last BM 3 hours.  She denies family history of coagulopathy, denies personal hx of prolonged bleeding. She denies alcohol or smoking use. She reports her only surgeries are C-section and hx of L nephrectomy. She has had 8 prior  pregnancies with 1 miscarriage.   Meds:  Intermittently taking iron supplements  Vitamins  No outpatient medications have been marked as taking for the 03/27/22 encounter [facility] Encounter).   Allergies: Allergies as of 03/24/2022   (No Known Allergies)   Past Medical History:  Diagnosis Date   Anemia due to chronic blood loss    Complication of anesthesia    a lot of rashes on skin when had C-section   Menorrhagia    Renal cell carcinoma of left kidney [facility])    Surgical history:  L radical nephrectomy  C-section x1  Family History: Denies family history of coagulopathies. Reports diabetes in family.   Social History: She lives with her partner and children. She is uninsured.   Review of Systems: A complete ROS was negative except as per HPI.   Physical Exam: There were no vitals taken for this visit. GEN: well-appearing, obese woman in no acute distress  CARDIO: RRR. No m/r/g LUNG: CTAB. Normal WOB on RA. ABD: Obese, soft, non-tender, non-distended. Non-tender to palpation in RLQ and LLQ.  MSK: soft, mobile, well-circumscribed, painless lump on L neck.  NEURO: alert and answering questions appropriately   Assessment & Plan by Problem: Principal Problem:   Symptomatic anemia Active Problems:   Renal cell carcinoma, left (HCC)   Menorrhagia   Fibroid  [name] is a 46 year old female with a PMHx of uterine fibroids, iron  deficiency anemia, clear cell RCC s/p L nephrectomy, GERD who presents as a direct admit from home for symptomatic anemia.   Symptomatic anemia Uterine Fibroids  Her anemia and symptoms including fatigue and lightheadedness is likely caused by menorrhagia from her uterine fibroids. Hgb 6.2 at clinic. She was previously on megace but per patient report she had not been taking this medication. She denies any other recent forms of birth control. She had prior admission in 2021 for severe anemia with pelvic fibroids on MRI. Prior  endometrial bx in 05/2020 negative for cancer. Also considered other causes of menorrhagia including coagulopathies or vWF deficiency. Patient denies family or individual history consistent with bleeding disorder, so lower suspicion at this time but will obtain labs to rule out. Given that she does not report hematochezia or hematemesis, also lower suspicion for active GI bleed at this time.  -admit to observation, attending Dr. Dareen Piano  -transfuse 1u pRBCs  -AM CBC  -f/u pregnancy test  -f/u TVUS -f/u PT/INR  -OBGYN consulted, appreciate recommendations   H/o iron deficiency anemia  Iron studies from most recent clinic visit consistent with iron deficiency anemia. She is currently intermittently taking PO iron. Will hold PO iron currently given she is going to receive RBC transfusion.   Lipoma  Lump on L side of neck appears consistent with lipoma.   Chronic:  GERD: home med omeprazole '20mg'$  daily  Dispo: Admit patient to Observation with expected length of stay less than 2 midnights.  Signed: Rolanda Lundborg, MD 03/27/2022, 4:15 PM  Pager: [phone] After 5pm on weekdays and 1pm on weekends: On Call pager: [phone]

## 2022-03-27 NOTE — Progress Notes (Signed)
NEW ADMISSION NOTE New Admission Note:   Arrival Method: Stretcher Mental Orientation: Alert and oriented times4 Telemetry: Assessment: Completed Skin: Assessed IV:18 Pain:none Tubes: Safety Measures: Safety Fall Prevention Plan has been given, discussed and implemented. Admission: Completed [facility] Orientation: Patient has been orientated to the room, unit and staff.  Family:   Orders have been reviewed and implemented. Will continue to monitor the patient. Call light has been placed within reach and bed alarm has been activated. Admission done with interpretor [place]  Virgina Jock, RN

## 2022-03-27 NOTE — Telephone Encounter (Signed)
Called and spoke to the patient through a telephone interpreting service and she is having worsening fatigue likely due to her chronic blood loss.  She has not received a call from the hospital yet.  I then contacted the hospital and they were able to find a bed for her.  They stated that they would contact the patient about this.  I then contacted the patient again and informed her of the bed and the fact that the hospital should call her with instructions.  I also cautioned the patient that if she feels any worse and has not received a call from the hospital that she should go to the emergency department.  Patient states her understanding.  Plan for direct admission today.  [name] Internal Medicine Residency, PGY 1

## 2022-03-28 ENCOUNTER — Other Ambulatory Visit (HOSPITAL_COMMUNITY): Payer: Self-pay

## 2022-03-28 LAB — GLUCOSE, CAPILLARY
Glucose-Capillary: 114 mg/dL — ABNORMAL HIGH (ref 70–99)
Glucose-Capillary: 82 mg/dL (ref 70–99)
Glucose-Capillary: 117 mg/dL — ABNORMAL HIGH (ref 70–99)

## 2022-03-28 LAB — CBC
HCT: 24.3 % — ABNORMAL LOW (ref 36.0–46.0)
HCT: 27.1 % — ABNORMAL LOW (ref 36.0–46.0)
Hemoglobin: 6.5 g/dL — CL (ref 12.0–15.0)
Hemoglobin: 7.4 g/dL — ABNORMAL LOW (ref 12.0–15.0)
MCH: 16.2 pg — ABNORMAL LOW (ref 26.0–34.0)
MCH: 17 pg — ABNORMAL LOW (ref 26.0–34.0)
MCHC: 26.7 g/dL — ABNORMAL LOW (ref 30.0–36.0)
MCHC: 27.3 g/dL — ABNORMAL LOW (ref 30.0–36.0)
MCV: 60.6 fL — ABNORMAL LOW (ref 80.0–100.0)
MCV: 62.3 fL — ABNORMAL LOW (ref 80.0–100.0)
Platelets: 457 10*3/uL — ABNORMAL HIGH (ref 150–400)
Platelets: 441 10*3/uL — ABNORMAL HIGH (ref 150–400)
RBC: 4.01 MIL/uL (ref 3.87–5.11)
RBC: 4.35 MIL/uL (ref 3.87–5.11)
RDW: 23.1 % — ABNORMAL HIGH (ref 11.5–15.5)
RDW: 24.8 % — ABNORMAL HIGH (ref 11.5–15.5)
WBC: 8.2 10*3/uL (ref 4.0–10.5)
WBC: 7.9 10*3/uL (ref 4.0–10.5)
nRBC: 0 % (ref 0.0–0.2)
nRBC: 0.3 % — ABNORMAL HIGH (ref 0.0–0.2)

## 2022-03-28 LAB — HIV ANTIBODY (ROUTINE TESTING W REFLEX): HIV Screen 4th Generation wRfx: NONREACTIVE

## 2022-03-28 LAB — PREPARE RBC (CROSSMATCH)

## 2022-03-28 LAB — HEMOGLOBIN AND HEMATOCRIT, BLOOD
HCT: 28 % — ABNORMAL LOW (ref 36.0–46.0)
Hemoglobin: 7.8 g/dL — ABNORMAL LOW (ref 12.0–15.0)

## 2022-03-28 MED ORDER — FERROUS SULFATE 325 (65 FE) MG PO TABS
325.0000 mg | ORAL_TABLET | ORAL | 1 refills | Status: AC
  Filled 2022-03-28 – 2022-03-31 (×2): qty 15, 30d supply, fill #0

## 2022-03-28 MED ORDER — NORETHINDRONE ACETATE 5 MG PO TABS
5.0000 mg | ORAL_TABLET | Freq: Every day | ORAL | 1 refills | Status: DC
  Filled 2022-03-28: qty 30, 30d supply, fill #0

## 2022-03-28 MED ORDER — SODIUM CHLORIDE 0.9% IV SOLUTION: Freq: Once | INTRAVENOUS | Status: DC

## 2022-03-28 NOTE — Discharge Summary (Signed)
Name: Deborah Higgins [facility] MRN: [id number] DOB: 06/16/1975 46 y.o. PCP: [name], DO  Date of Admission: 03/27/2022  3:32 PM Date of Discharge: 03/28/2022 Attending Physician: Dr. Dareen Piano  Discharge Diagnosis: Principal Problem:   Symptomatic anemia Active Problems:   Renal cell carcinoma, left ([place])   Menorrhagia   Fibroid    Discharge Medications: Allergies as of 03/28/2022   No Known Allergies      Medication List     TAKE these medications    B-12 PO Take 1 tablet by mouth daily.   ferrous sulfate 325 (65 FE) MG tablet Take 1 tablet (325 mg total) by mouth every other day.   FISH OIL PO Take 1 capsule by mouth daily.   ibuprofen 200 MG tablet Commonly known as: ADVIL Take 200 mg by mouth daily.   MAGNESIUM OXIDE PO Take 1 tablet by mouth daily.   norethindrone 5 MG tablet Commonly known as: AYGESTIN Tome 1 tableta (5 mg en total) por va oral diariamente. (Take 1 tablet (5 mg total) by mouth daily.) Start taking on: March 29, 2022   omeprazole 20 MG capsule Commonly known as: PRILOSEC Tome 1 cpsula (20 mg en total) por va oral diariamente. (Take 1 capsule (20 mg total) by mouth daily.)   polyethylene glycol 17 g packet Commonly known as: MIRALAX / GLYCOLAX Mezcle 17 g (1 paquete) en 8 onzas de bebida y tmelo por va oral diariamente. (Mix 17 g (1 packet) in 8 ounces of beverage and take by mouth daily.)        Disposition and follow-up:   [name] was discharged from [facility] in stable condition.  At the hospital follow up visit please address:  1.  Follow-up:  a. Menorrhagia and symptoms on norethindrone   b. Anemia   2.  Labs / imaging needed at time of follow-up: CBC to monitor Hgb   3.  Pending labs/ test needing follow-up: None  Follow-up Appointments:  Follow-up [place] for [facility] Follow up.    Specialty: Obstetrics and Gynecology Why: Please call to schedule a follow up appointment Contact information: [place] [address] [phone]        Kerin Perna, NP Follow up.   Specialty: Internal Medicine Why: TIME : 9:30 AM DATE : [place] Contact information: [address] [place] [address] [place] [facility] Course by problem list: [name] is a 46 year old female with a PMHx of uterine fibroids, iron deficiency anemia, clear cell RCC s/p L nephrectomy, GERD who presents as a direct admit from home for symptomatic anemia.   Symptomatic anemia Uterine Fibroids  Her anemia and symptoms including fatigue and lightheadedness is likely caused by menorrhagia from her uterine fibroids. Hgb 6.2 at clinic. She was previously on megace but per patient report she had not been taking this medication. She denies any other recent forms of birth control. She had prior admission in 2021 for severe anemia with pelvic fibroids on MRI. Prior endometrial bx in 05/2020 negative for cancer. Also considered other causes of menorrhagia including coagulopathies or vWF deficiency. Patient denies family or individual history consistent with bleeding disorder, so lower suspicion at this time. PT/INR within normal limits. Given that she does not report hematochezia or hematemesis, also lower suspicion for active GI  bleed at this time. She required 2u pRBCs during this admission. 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 OBGYN was consulted who recommended daily progesterone only pills and outpatient follow-up. Hgb at time of discharge was 7.4.    H/o iron deficiency anemia  Iron studies from most recent clinic visit consistent with iron deficiency anemia. She is currently intermittently taking PO iron. She was given IV iron and discharged on PO iron.   Social work needs  Patient is  uninsured. TOC consulted and they will have financial counselor see her regarding financial assistance options to see specialists and for medication assistance.    Chronic:  GERD: home med omeprazole '20mg'$  daily   Subjective: Patient was interviewed with the assistance of interpreter [phone]. Denies tiredness, headache. Feels better than yesterday. Not having bleeding right now. Had a little bit of bleeding early this morning. Denies chest pain and abdominal pain. Discussed fibroid uterus. Discussed adenomyosis. Discussed transfusion of 2 units PRBCs. Discussed treatment with iron supplementation. Discussed treatment with oral medications on further discussion with pharmacist, patient is willing to pay for norethindrone. Answered questions regarding medical therapy for abnormal uterine bleeding.  Discharge Vitals:   BP 134/64   Pulse 63   Temp 98.9 F (37.2 C) (Oral)   Resp 17   Wt 237 lb 3.4 oz (107.6 kg)   SpO2 100%   BMI 43.39 kg/m  Discharge exam: General: Pleasant, well-appearing Spanish-speaking female in bed. No acute distress. CV: RRR. No murmurs, rubs, or gallops. No LE edema Pulmonary: Lungs CTAB. Normal effort. No wheezing or rales. Abdominal: Obese, soft, nontender, nondistended. Normal bowel sounds. Skin: Warm and dry. No obvious rash or lesions. Neuro: Alert and answering questions appropriately. No focal deficit. Psych: Normal mood and affect  Pertinent Labs, Studies, and Procedures:     Latest Ref Rng & Units 03/28/2022    2:57 PM 03/28/2022   11:54 AM 03/28/2022    3:06 AM  CBC  WBC 4.0 - 10.5 K/uL 7.9   8.2   Hemoglobin 12.0 - 15.0 g/dL 7.4  7.8  6.5   Hematocrit 36.0 - 46.0 % 27.1  28.0  24.3   Platelets 150 - 400 K/uL 441   457        Latest Ref Rng & Units 02/20/2022   10:51 AM 07/21/2020   11:49 AM 05/01/2020    2:58 PM  CMP  Glucose 70 - 99 mg/dL 105  93  108   BUN 6 - 24 mg/dL  17  15   Creatinine 0.57 - 1.00 mg/dL  0.93  0.90   Sodium 134 - 144  mmol/L  139  139   Potassium 3.5 - 5.2 mmol/L  4.4  4.0   Chloride 96 - 106 mmol/L  103  105   CO2 20 - 29 mmol/L  20    Calcium 8.7 - 10.2 mg/dL  10.1    Total Protein 6.0 - 8.5 g/dL  7.7    Total Bilirubin 0.0 - 1.2 mg/dL  0.3    Alkaline Phos 44 - 121 IU/L  84    AST 0 - 40 IU/L  16    ALT 0 - 32 IU/L  25      US PELVIC COMPLETE WITH TRANSVAGINAL Result Date: 03/27/2022 CLINICAL DATA:  Menorrhagia EXAM: TRANSABDOMINAL AND TRANSVAGINAL ULTRASOUND OF PELVIS DOPPLER ULTRASOUND OF OVARIES TECHNIQUE: IMPRESSION: Uterine fibroids with suspected superimposed adenomyosis. Otherwise negative pelvic ultrasound. No evidence of ovarian torsion.      Discharge  Instructions      Larose Hires, ha sido un placer cuidarte y me Sports administrator ver que te encuentras bien.  Fuiste hospitalizada por anemia y recibiste tratamiento con transfusiones de glbulos rojos y Sport and exercise psychologist. Desde entonces, ya no necesitas ms transfusiones.  Adems de los medicamentos, tambin fuiste evaluada por nuestro mdico de obstetricia y Animal nutritionist.  Cuando te den el alta, nos gustara que realices lo siguiente:  1. Empezaste a Social worker hospital. Por favor, contina tomndolos siguiendo estas instrucciones:    - Toma noretindrona diariamente.    - Toma un suplemento de hierro [employer].  2. Contina tomando el resto de los medicamentos como lo hacas antes de ingresar al hospital.  3. Realiza un seguimiento con tu mdico de atencin primaria y la clnica de obstetricia y Animal nutritionist.  Cudate mucho!  [place]    Signed: Rolanda Lundborg, MD PGY-1 03/28/2022, 4:14 PM   Pager: [phone]

## 2022-03-28 NOTE — TOC Progression Note (Addendum)
Transition of Care [facility]) - Progression Note    Patient Details  Name: Deborah Higgins MRN: [id number] Date of Birth: [birth year]  Transition of Care [facility]) CM/SW Contact  Loletha Grayer Beverely Pace, RN Phone Number: 03/28/2022, 9:31 AM  Clinical Narrative:    Case Manager contacted by MD to get appt for f/u with OB/GYN. CM requested CMA assistance with locating clinic that can provide service. Patient will be notified. Financial counselor emailed and requested to contact patient as well.          Expected Discharge Plan and Services                                                 Social Determinants of Health (SDOH) Interventions    Readmission Risk Interventions     No data to display

## 2022-03-28 NOTE — Plan of Care (Signed)
  Problem: Health Behavior/Discharge Planning: Goal: Ability to manage health-related needs will improve Outcome: Completed/Met   Problem: Clinical Measurements: Goal: Ability to maintain clinical measurements within normal limits will improve Outcome: Completed/Met Goal: Will remain free from infection Outcome: Completed/Met Goal: Diagnostic test results will improve Outcome: Completed/Met Goal: Respiratory complications will improve Outcome: Completed/Met Goal: Cardiovascular complication will be avoided Outcome: Completed/Met

## 2022-03-28 NOTE — Progress Notes (Signed)
Patient educated with SWOT RN Rip Harbour and interpreter discharge instructions and to come back and get her medications from the [facility] tomorrow.  TOC pharmacy is closed and main pharmacy was not given meds for discharge. Patient verbalized understanding.  Aurther Loft, RN

## 2022-03-28 NOTE — TOC Initial Note (Signed)
Transition of Care [facility]) - Initial/Assessment Note    Patient Details  Name: Deborah Higgins MRN: [id number] Date of Birth: 14-Nov-1975  Transition of Care [facility]) CM/SW Contact:    Ninfa Meeker, RN Phone Number: 03/28/2022, 9:30 AM  Clinical Narrative:                 Transition of Care Screening Note Transition of Care Department [facility]) has reviewed patient and no TOC needs have been identified at this time. We will continue to monitor patient advancement through Interdisciplinary progressions. If new patient transition needs arise, please place a consult.       Patient Goals and CMS Choice        Expected Discharge Plan and Services                                                Prior Living Arrangements/Services                       Activities of Daily Living Home Assistive Devices/Equipment: None ADL Screening (condition at time of admission) Patient's cognitive ability adequate to safely complete daily activities?: Yes Is the patient deaf or have difficulty hearing?: No Does the patient have difficulty seeing, even when wearing glasses/contacts?: No Does the patient have difficulty concentrating, remembering, or making decisions?: No Patient able to express need for assistance with ADLs?: No Does the patient have difficulty dressing or bathing?: No Independently performs ADLs?: Yes (appropriate for developmental age) Does the patient have difficulty walking or climbing stairs?: No Weakness of Legs: None Weakness of Arms/Hands: None  Permission Sought/Granted                  Emotional Assessment              Admission diagnosis:  Menorrhagia [N92.0] Patient Active Problem List   Diagnosis Date Noted   Symptomatic anemia 03/27/2022   Fibroid 03/27/2022   Class 3 obesity ([place]) 03/24/2022   Menorrhagia 03/24/2022   Gastroesophageal reflux disease 03/24/2022   Constipation 03/24/2022   Abdominal pain 03/24/2022    Healthcare maintenance 03/24/2022   Finger pain 03/24/2022   Lipoma of neck 03/24/2022   Renal cell carcinoma, left ([place]) 06/14/2017   Iron deficiency anemia 06/14/2017   Leukocytosis 06/14/2017   Thrombocytosis 06/14/2017   PCP:  [name], DO Pharmacy:   [id number] - [place], [place] - [address] AT [facility] [address] & [place] [address] Phone: [phone] Fax: [place], [place] AT [address] [place] [address] Phone: [phone] Fax: [phone]  [facility] - [place] - [address] [place] [address] Phone: [phone] Fax: [facility] [address] [place] [address] Phone: [phone] Fax: [phone]     Social Determinants of Health (SDOH) Interventions    Readmission Risk Interventions     No data to display

## 2022-03-28 NOTE — Progress Notes (Signed)
NAME:  Deborah Higgins, MRN:  [id number], DOB:  07/19/1975, LOS: 0 ADMISSION DATE:  03/27/2022  Subjective  NAEON/Overnight events: Patient required an additional unit of RBCs.   Patient evaluated at bedside this AM. Patient was interviewed with the assistance of interpreter [phone]. Denies tiredness, headache. Feels better than yesterday. Not having bleeding right now. Had a little bit of bleeding early this morning. Denies chest pain and abdominal pain. Discussed fibroid uterus. Discussed adenomyosis. Discussed transfusion of 2 units PRBCs. Discussed treatment with iron supplementation. Discussed treatment with oral medications. On further discussion with pharmacist, patient is willing to pay for norethindrone. Answered questions regarding medical therapy for abnormal uterine bleeding.  Objective   Blood pressure 138/66, pulse 69, temperature 98.4 F (36.9 C), temperature source Oral, resp. rate 18, weight 107.6 kg, SpO2 100 %.     Intake/Output Summary (Last 24 hours) at 03/28/2022 0725 Last data filed at 03/28/2022 0400 Gross per 24 hour  Intake 376 ml  Output 0 ml  Net 376 ml   Filed Weights   03/28/22 0434  Weight: 107.6 kg   Physical Exam: General: well-appearing Hispanic female laying in bed in no acute distress CV: RRR. No m/r/g Pulm: CTAB. Normal WOB on RA. Abdomen: Soft, non-tender, non-distended Neuro: alert and answering and asking questions appropriately.  Psych: pleasant mood and affect.   Labs       Latest Ref Rng & Units 03/28/2022    3:06 AM 03/24/2022   12:28 PM 07/21/2020   11:49 AM  CBC  WBC 4.0 - 10.5 K/uL 8.2  7.6  7.3   Hemoglobin 12.0 - 15.0 g/dL 6.5  6.2  10.7   Hematocrit 36.0 - 46.0 % 24.3  23.5  35.4   Platelets 150 - 400 K/uL 457  518  399       Latest Ref Rng & Units 02/20/2022   10:51 AM 07/21/2020   11:49 AM 05/01/2020    2:58 PM  BMP  Glucose 70 - 99 mg/dL 105  93  108   BUN 6 - 24 mg/dL  17  15   Creatinine 0.57 - 1.00  mg/dL  0.93  0.90   BUN/Creat Ratio 9 - 23  18    Sodium 134 - 144 mmol/L  139  139   Potassium 3.5 - 5.2 mmol/L  4.4  4.0   Chloride 96 - 106 mmol/L  103  105   CO2 20 - 29 mmol/L  20    Calcium 8.7 - 10.2 mg/dL  10.1     Imaging:  TRANSABDOMINAL AND TRANSVAGINAL ULTRASOUND OF PELVIS DOPPLER ULTRASOUND OF OVARIES IMPRESSION: Uterine fibroids with suspected superimposed adenomyosis. Otherwise negative pelvic ultrasound. No evidence of ovarian torsion.  Summary  [name] is a 46 year old female with a PMHx of uterine fibroids, iron deficiency anemia, clear cell RCC s/p L nephrectomy, GERD who presents as a direct admit from home for symptomatic anemia.   Assessment & Plan:  Principal Problem:   Symptomatic anemia Active Problems:   Renal cell carcinoma, left (HCC)   Menorrhagia   Fibroid  Symptomatic anemia  H/o uterine fibroids  Post-transfusion H&H without much improvement in Hgb so she was given an additional unit of pRBCs. She is now s/p 2u pRBCs. OBGYN recommended daily progesterone only pills daily to help control bleeding and outpatient follow-up. PT/INR within normal limits so less suspicious for bleeding disorder. Pelvic U/S with fibroids and suspected adenomyosis. Her symptomatic anemia is likely due  to these co-existing conditions. Post-transfusion H&H improved. -f/u CBC -continue norethindrone '5mg'$  daily  -outpatient follow-up with OBGYN  H/o iron deficiency anemia  Iron studies from most recent clinic visit consistent with iron deficiency anemia. She is currently intermittently taking PO iron. Per Deborah Higgins, her total iron deficit is a little less than 2g. Thus, will additionally replete her iron stores with IV iron.  -IV ferrlecit x 4 doses (10/16-10/19) -discharge on oral iron  Social work needs  Patient is uninsured. TOC consulted and they will have financial counselor see her regarding financial assistance options to see specialist and  for medication assistance.   Lipoma  Lump on L side of neck appears consistent with lipoma.   Chronic:  GERD: home med omeprazole '20mg'$  daily  Best practice:  DIET: regular IVF: none DVT PPX: SCDs BOWEL: miralax as needed CODE: FULL  Dispo: Home Barriers to discharge: needed additional transfusion, will need to recheck CBC prior to discharge  Deborah Lundborg, MD Internal Medicine Resident PGY-1 PAGER: [phone] 03/28/2022 7:25 AM  If after hours (below), please contact on-call pager: [phone] 5PM-7AM Monday-Friday 1PM-7AM Saturday-Sunday

## 2022-03-28 NOTE — Progress Notes (Signed)
DISCHARGE NOTE HOME Deborah Higgins to be discharged Home per MD order. Discussed prescriptions and follow up appointments with the patient. Prescriptions given to patient; medication list explained in detail. Patient verbalized understanding.  Skin clean, dry and intact without evidence of skin break down, no evidence of skin tears noted. IV catheter discontinued intact. Site without signs and symptoms of complications. Dressing and pressure applied. Pt denies pain at the site currently. No complaints noted.  Patient free of lines, drains, and wounds.   An After Visit Summary (AVS) was printed and given to the patient. Patient escorted via wheelchair, and discharged home via private auto.  Vira Agar, RN

## 2022-03-28 NOTE — Progress Notes (Addendum)
Internal Medicine Attending:   I saw and examined the patient. I reviewed the resident's H and P note and I agree with the resident's findings and plan as documented in the resident's note.  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  Patient has a history of heavy menstrual bleeding and started her period last week around Wednesday to Thursday.  Patient bleeds twice a month and has to use up to 10 pads a day.  Patient states that her menstrual cycles usually last between 5 and 10 days and are associated with abdominal pain.  Patient also complained of some associated headache, nausea and lightheadedness as well as fatigue.  She is not currently on Megace or birth control.  Today, patient states that she feels better after receiving a unit of blood and states that she bled a little bit this morning but none since then.  Her headache and lightheadedness have resolved.  Vitals:   03/28/22 0544 03/28/22 0834  BP: 138/66 134/64  Pulse: 69 63  Resp: 18 17  Temp: 98.4 F (36.9 C) 98.9 F (37.2 C)  SpO2: 100% 100%   On exam, patient is laying in bed in no apparent distress.  Cardiovascular exam reveals regular rate and rhythm with normal heart sounds.  Lungs clear to auscultation bilaterally.  Abdomen is soft, nontender, nondistended with normoactive bowel sounds.  Extremities are nontender to palpation and have no pitting edema.  Patient has a normal mood and affect.  Patient is a 46 year old female with a past medical history of iron deficiency anemia and heavy menstrual bleeding in the setting of a history of uterine fibroids who was found to be severely anemic with a hemoglobin of 6.2 on outpatient blood work.  Patient received 1 unit of PRBC here  with subsequent improvement of her hemoglobin to 7.8.  Patient's symptoms of lightheadedness and headache have resolved.  Patient has no further bleeding episodes currently.  Patient vitals are remained stable.  Patient did have a transvaginal ultrasound done on admission which revealed fibroids and possible adenomyosis.  GYN follow-up recommendations appreciated.  They recommended starting patient on progesterone only pill but patient may not be able to afford this.  We will start the patient on Megace after discussion with OB/GYN to see if this will help with her symptoms.  We will repeat a CBC today and if hemoglobin remains stable patient should be stable for DC home.  Patient will need outpatient follow-up with GYN as well as our clinic.  We will need repeat CBC next week as an outpatient as well.  Patient was noted to have severe iron deficiency anemia likely secondary to her underlying menorrhagia from her uterine fibroids.  She was given a dose of IV iron here.  We will need to start oral iron supplements on discharge.

## 2022-03-28 NOTE — Discharge Instructions (Addendum)
Deborah Higgins, ha sido un placer cuidarte y me Sports administrator ver que te encuentras bien.  Fuiste hospitalizada por anemia y recibiste tratamiento con transfusiones de glbulos rojos y Sport and exercise psychologist. Desde entonces, ya no necesitas ms transfusiones.  Adems de los medicamentos, tambin fuiste evaluada por nuestro mdico de obstetricia y Animal nutritionist.  Cuando te den el alta, nos gustara que realices lo siguiente:  1. Empezaste a Social worker hospital. Por favor, contina tomndolos siguiendo estas instrucciones:    - Toma noretindrona diariamente.    - Toma un suplemento de hierro [employer].  2. Contina tomando el resto de los medicamentos como lo hacas antes de ingresar al hospital.  3. Realiza un seguimiento con tu mdico de atencin primaria y la clnica de obstetricia y Animal nutritionist.  Cudate mucho!  Servicio de [place]

## 2022-03-29 ENCOUNTER — Other Ambulatory Visit (HOSPITAL_COMMUNITY): Payer: Self-pay

## 2022-03-29 LAB — BPAM RBC
Blood Product Expiration Date: 202311152359
Blood Product Expiration Date: 202311162359
ISSUE DATE / TIME: 202310161809
ISSUE DATE / TIME: 202310170521
Unit Type and Rh: 5100
Unit Type and Rh: 5100

## 2022-03-29 LAB — TYPE AND SCREEN
ABO/RH(D): O POS
Antibody Screen: NEGATIVE
Unit division: 0
Unit division: 0

## 2022-03-31 ENCOUNTER — Other Ambulatory Visit (HOSPITAL_COMMUNITY): Payer: Self-pay

## 2022-04-11 ENCOUNTER — Encounter: Payer: No Typology Code available for payment source | Admitting: Student

## 2022-04-12 ENCOUNTER — Encounter: Payer: Self-pay | Admitting: Student

## 2022-04-14 ENCOUNTER — Inpatient Hospital Stay (INDEPENDENT_AMBULATORY_CARE_PROVIDER_SITE_OTHER): Payer: Self-pay | Admitting: Primary Care

## 2022-05-08 ENCOUNTER — Ambulatory Visit (INDEPENDENT_AMBULATORY_CARE_PROVIDER_SITE_OTHER): Payer: Self-pay | Admitting: Student

## 2022-05-08 ENCOUNTER — Other Ambulatory Visit (HOSPITAL_COMMUNITY): Payer: Self-pay

## 2022-05-08 ENCOUNTER — Other Ambulatory Visit: Payer: Self-pay

## 2022-05-08 ENCOUNTER — Encounter: Payer: Self-pay | Admitting: Student

## 2022-05-08 VITALS — BP 127/64 | HR 74 | Temp 99.2°F | Ht 64.0 in | Wt 230.7 lb

## 2022-05-08 DIAGNOSIS — N920 Excessive and frequent menstruation with regular cycle: Secondary | ICD-10-CM

## 2022-05-08 DIAGNOSIS — D509 Iron deficiency anemia, unspecified: Secondary | ICD-10-CM

## 2022-05-08 DIAGNOSIS — M65339 Trigger finger, unspecified middle finger: Secondary | ICD-10-CM

## 2022-05-08 DIAGNOSIS — D219 Benign neoplasm of connective and other soft tissue, unspecified: Secondary | ICD-10-CM

## 2022-05-08 MED ORDER — NORETHINDRONE ACETATE 5 MG PO TABS
5.0000 mg | ORAL_TABLET | Freq: Every day | ORAL | 1 refills | Status: DC
  Filled 2022-05-08: qty 30, 30d supply, fill #0

## 2022-05-08 NOTE — Progress Notes (Unsigned)
   CC: Hospital follow-up  HPI:  [name] is a 46 y.o. female with history as below who presents for hospital follow-up after being admitted for severe iron deficiency anemia secondary to heavy menstrual bleeding caused by fibroids.  Please see encounters tab for problem-based charting.  Video interpreter service was used during this visit.  Past Medical History:  Diagnosis Date   Anemia due to chronic blood loss    Complication of anesthesia    a lot of rashes on skin when had C-section   Menorrhagia    Renal cell carcinoma of left kidney (HCC)    Review of Systems:   A comprehensive review of systems was negative except for: Heavy menstrual cycle, mild fatigue, finger cramping, mild depressed mood   Physical Exam:  Vitals:   05/08/22 1547  BP: 127/64  Pulse: 74  Temp: 99.2 F (37.3 C)  TempSrc: Oral  SpO2: 100%  Weight: 230 lb 11.2 oz (104.6 kg)  Height: '5\' 4"'$  (1.626 m)   Constitutional: Obese middle-age female. In no acute distress. HENT: Normocephalic, atraumatic,  Eyes: Sclera non-icteric, EOM intact Cardio:Regular rate and rhythm. No murmurs, rubs, or gallops. 2+ bilateral radial pulses. Pulm:Clear to auscultation bilaterally. Normal work of breathing on room air. Abdomen: Soft, non-tender, non-distended, positive bowel sounds. IRC:VELFYBOF for extremity edema. Skin:Warm and dry. Neuro:Alert and oriented x3. No focal deficit noted. Psych:Pleasant mood and affect.   Assessment & Plan:   See Encounters Tab for problem based charting.  Patient seen with Dr.  Saverio Danker

## 2022-05-08 NOTE — Patient Instructions (Addendum)
Deborah Higgins, por permitirnos brindarle su atencin hoy. Hoy discutimos. . .  > Ciclos menstruales abundantes        -Enviaremos el medicamento anticonceptivo a la farmacia aqu en [facility] para que usted lo recoja. Creemos que esto debera ayudar a su sangrado. Tambin nos comunicaremos con un obstetra/gineclogo para ver si les gustara que usted haga un seguimiento en su clnica. Tambin te recomendamos que llames a su clnica para hacer la misma pregunta. Su nmero es [phone]. Tome 5 mg de noretindrona al [employer]. Si tiene algn sntoma similar a los que tena antes de ir al hospital, llame a nuestra clnica y podremos atenderlo antes. De lo contrario nos veremos en 1 mes para reevaluar. Tambin lo llamaremos hoy con los resultados de su anlisis de [place].  He ordenado los siguientes laboratorios para usted:   rdenes de laboratorio       CBC con diferencia   Pruebas ordenadas hoy:  Ninguno   Referencias ordenadas hoy:  rdenes de referencia No se solicitaron referencias hoy     He ordenado/cambiado los siguientes medicamentos:  Suspenda los siguientes medicamentos: Medicamentos discontinuados durante este encuentro Motivo de la medicacin  tableta de noretindrona (AYGESTIN) de 5 MG Reordenar    Inicie los siguientes medicamentos: Los mdicos ordenaron este encuentro Medicamentos  tableta de noretindrona (AYGESTIN) de 5 mg Sig: Tome 1 tableta (5 mg en total) por va oral al da. Dispensacin: 30 comprimidos Recarga: 1     Seguimiento: 1 mes   Recordar:   Si tiene alguna pregunta o inquietud, llame a la clnica de medicina interna al [phone].   Deborah Higgins, [place]   Thank you, [name] Deborah Higgins, for allowing [place] to provide your care today. Today we discussed . . .  > Heavy menstrual cycles       -We will send the contraceptive medication  into the pharmacy here at [facility] for you to pick up.  We think that this should help your bleeding.  We will also contact OB/GYN to see if they would like you to follow-up in their clinic.  We also recommend that you call their clinic to ask the same question.  Their number is [phone].  Please take the norethindrone 5 mg daily, every day.  If you have any symptoms similar to the ones that you have before going to the hospital please call our clinic and we can see you sooner.  Otherwise we will see you in 1 month to reevaluate.  We will also call you with the results of your blood test today.  I have ordered the following labs for you:   Lab Orders         CBC with Diff       Tests ordered today:  None   Referrals ordered today:   Referral Orders  No referral(s) requested today      I have ordered the following medication/changed the following medications:   Stop the following medications: Medications Discontinued During This Encounter  Medication Reason   norethindrone (AYGESTIN) 5 MG tablet Reorder     Start the following medications: Meds ordered this encounter  Medications   norethindrone (AYGESTIN) 5 MG tablet    Sig: Take 1 tablet (5 mg total) by mouth daily.    Dispense:  30 tablet    Refill:  1      Follow up:  1 Month  Remember:     Should you have any questions or concerns please call the internal medicine clinic at [phone].     Deborah Higgins, [place]

## 2022-05-09 DIAGNOSIS — M6530 Trigger finger, unspecified finger: Secondary | ICD-10-CM | POA: Insufficient documentation

## 2022-05-09 LAB — CBC WITH DIFFERENTIAL/PLATELET
Basophils Absolute: 0.1 10*3/uL (ref 0.0–0.2)
Basos: 1 %
EOS (ABSOLUTE): 0.3 10*3/uL (ref 0.0–0.4)
Eos: 3 %
Hematocrit: 34.1 % (ref 34.0–46.6)
Hemoglobin: 10.3 g/dL — ABNORMAL LOW (ref 11.1–15.9)
Immature Grans (Abs): 0 10*3/uL (ref 0.0–0.1)
Immature Granulocytes: 0 %
Lymphocytes Absolute: 2.6 10*3/uL (ref 0.7–3.1)
Lymphs: 27 %
MCH: 21.5 pg — ABNORMAL LOW (ref 26.6–33.0)
MCHC: 30.2 g/dL — ABNORMAL LOW (ref 31.5–35.7)
MCV: 71 fL — ABNORMAL LOW (ref 79–97)
Monocytes Absolute: 0.6 10*3/uL (ref 0.1–0.9)
Monocytes: 6 %
Neutrophils Absolute: 6 10*3/uL (ref 1.4–7.0)
Neutrophils: 63 %
Platelets: 435 10*3/uL (ref 150–450)
RBC: 4.8 x10E6/uL (ref 3.77–5.28)
WBC: 9.5 10*3/uL (ref 3.4–10.8)

## 2022-05-09 NOTE — Assessment & Plan Note (Addendum)
Patient returns after interactive mission for severe iron deficiency anemia secondary to chronic blood loss as result of uterine fibroids. Hemoglobin on admission was 6.2 and rose to 7.4 on discharge. During admission she received 2 units of packed red blood cells and an iron infusion as well as prescribed oral iron.  She was also seen by OB/GYN during admission who recommended starting progesterone daily and outpatient follow-up.  Unfortunately she was supposed to receive her discharge medications from [facility] but this did not occur and she was also unable to pick them up from the pharmacy due to an issue with the prescription.  She has not been taking the progesterone but she has been taking her iron supplement.  She has had another menstrual cycle which lasted about a week and was as heavy as previously which ended almost week ago.  She does have some fatigue but denies any severe fatigue similar to her symptoms before going to the hospital.  It appears she was also supposed to follow-up with OB/GYN for further evaluation of her fibroids but this point was not made. - CBC today shows rising hemoglobin at 10.3 - OB/GYN contacted to facilitate scheduling a follow-up appointment - Return in 1 month for reevaluation with repeat CBC and iron studies - Start norethindrone 5 mg daily

## 2022-05-09 NOTE — Assessment & Plan Note (Signed)
During my physical exam I advised that the patient had tape over the PIP joints of her third fingers on both hands.  She notes that these are there to prevent them from locking into flexed position at the MCP PIP and DIP joint.  She has noticed that this has gotten worse since she started taking her iron.  Unfortunately due to time I was not able to fully evaluate this and we will reevaluate in 1 month for possible steroid injection for trigger finger.

## 2022-05-09 NOTE — Assessment & Plan Note (Signed)
As described in iron deficiency anemia, unfortunately patient did not receive her progesterone contraceptives upon discharge and has not been taking these.  She did have another menstrual cycle that was very heavy and lasted about a week.  The cycle ended about a week ago.  She has been fatigued but not as much as when she went to the hospital.  CBC today showed rising hemoglobin at 10.3.  OB/GYN was called and confirmed that the patient should have been scheduled for follow-up after discharge.  They will call the patient to schedule follow-up.

## 2022-05-09 NOTE — Progress Notes (Signed)
Using a telephone interpreter I called the patient and discussed her CBC results with her.  Her hemoglobin has risen from 7.4-10.3 after leaving the hospital despite having not been able to start her progesterone therapy.  She has not started this and she was also informed that she should follow-up with OB/GYN and that they should be calling her.  She was also advised to call their office and make an appointment.  All questions were answered, no changes in therapy at this point.

## 2022-05-09 NOTE — Progress Notes (Signed)
Internal Medicine Clinic Attending  I saw and evaluated the patient.  I personally confirmed the key portions of the history and exam documented by Dr. Nikki Dom and I reviewed pertinent patient test results.  The assessment, diagnosis, and plan were formulated together and I agree with the documentation in the resident's note. Dr. Nikki Dom to discuss [facility] card and financial assistance paperwork with Ms. Moreno-Flores. Improvement in hemoglobin with iron supplementation. Ideally, we would like to check iron stores to monitor response, however we will defer until Ms. Moreno-Flores obtains insurance coverage.

## 2022-07-11 ENCOUNTER — Telehealth: Payer: Self-pay

## 2022-07-11 NOTE — Telephone Encounter (Signed)
Health Coaching 3: 07/10/22  interpreter- Rudene Anda, [employer]   Goals- Start exercising more. Patient will start with walking 2-3 days per week for 10-15 minutes. Patient will work on reaching this goal over the next 3 months. Patient will increase to 5 days a week for 20-30 minutes once first goal is reached.    New goal- Patient has been walking 2-3 days per week for 30-60 minutes. Increase walking to 5 days a week for 20-30 minutes. Increase fruit intake to at least 1 serving daily.  Barrier to reaching goal-    Strategies to overcome-    Navigation:  Patient is aware of  a follow up session. Patient is scheduled for FU on 09/04/22 @ 10:30 am.   Time- 10 minutes

## 2022-09-04 ENCOUNTER — Inpatient Hospital Stay: Payer: Self-pay | Attending: Obstetrics and Gynecology | Admitting: *Deleted

## 2022-09-04 VITALS — BP 138/86 | Ht 62.0 in | Wt 230.2 lb

## 2022-09-04 DIAGNOSIS — Z0000 Encounter for general adult medical examination without abnormal findings: Secondary | ICD-10-CM

## 2022-09-04 NOTE — Progress Notes (Signed)
Wisewoman follow up   Interpreter: Rudene Anda, [employer]  Clinical Measurement:  There were no vitals filed for this visit.    Medical History: Patient states that she  does not know if she has  high cholesterol,  does not know if she has  high blood pressure and she does not have diabetes.  Medications: Patient states that she does not take medication to lower cholesterol, blood pressure and blood sugar.  Patient does not take an aspirin a day to help prevent a heart attack or stroke.    Blood pressure, self measurement: Patient states that she does not measure blood pressure from home. She checks her blood pressure N/A. She shares her readings with a health care provider: N/A.   Nutrition: Patient states that on average she eats 2 cups of fruit and 0 cups of vegetables per day. Patient states that she does eat fish at least 2 times per week. Patient eats less than half servings of whole grains. Patient drinks less than 36 ounces of beverages with added sugar weekly: yes. Patient is currently watching sodium or salt intake: yes. In the past 7 days patient has had 0 drinks containing alcohol. On average patient drinks 0 drinks containing alcohol per day.      Physical activity: Patient states that she gets 60 minutes of moderate and 0 minutes of vigorous physical activity each week.  Smoking status: Patient states that she has has never smoked .   Quality of life: Over the past 2 weeks patient states that she had little interest or pleasure in doing things: nearly everyday. She has been feeling down, depressed or hopeless:nearly everyday.   Social Determinants of Health Assessment:   Food Insecurities: During the last 12 months, where there any times when you were worried that you would run out of food because of a lack of money or other resources: No.   Transportation Barriers: During the last 12 months, have you missed a doctor's appointment because of transportation problems: No.     Risk reduction and counseling:   Health Coaching: Spoke with patient about continuing to watch the amount of sweet and sugary foods that she consumes as well as carbs due to elevated glucose during initial screening. Spoke with patient about the recommendations for exercise. (20-30 minutes daily, 150 minutes weekly).   Navigation: This was the  follow up session for this patient, I will check up on her progress in the coming months. [facility] [place] referral for counseling services. Referral to [facility] for heavy and painful periods.   Time: 20 minutes

## 2022-10-18 ENCOUNTER — Ambulatory Visit: Payer: Self-pay | Admitting: Obstetrics and Gynecology

## 2023-02-23 ENCOUNTER — Other Ambulatory Visit: Payer: Self-pay

## 2023-02-23 ENCOUNTER — Inpatient Hospital Stay: Payer: Self-pay | Attending: Obstetrics and Gynecology | Admitting: *Deleted

## 2023-02-23 VITALS — BP 120/76 | Ht 62.0 in | Wt 210.0 lb

## 2023-02-23 DIAGNOSIS — Z0000 Encounter for general adult medical examination without abnormal findings: Secondary | ICD-10-CM

## 2023-02-23 DIAGNOSIS — N6489 Other specified disorders of breast: Secondary | ICD-10-CM

## 2023-02-23 NOTE — Progress Notes (Signed)
Wisewoman initial screening   Interpreter- Natale Lay, [employer]   Clinical Measurement: There were no vitals filed for this visit. Fasting Labs Drawn Today, will review with patient when they result.   Medical History: Patient states that she does not have high cholesterol, does not have high blood pressure and she does not have diabetes.  Medications: Patient states that she does not take medication to lower cholesterol, blood pressure or blood sugar.  Patient does not take an aspirin a day to help prevent a heart attack or stroke.    Blood pressure, self measurement: Patient states that she does not measure blood pressure from home. She checks her blood pressure N/A. She shares her readings with a health care provider: N/A.   Nutrition: Patient states that on average she eats 0 cups of fruit and 0 cups of vegetables per day. Patient states that she does eat fish at least 2 times per week. Patient eats more than half servings of whole grains. Patient drinks less than 36 ounces of beverages with added sugar weekly: yes. Patient is currently watching sodium or salt intake: yes. In the past 7 days patient has consumed drinks containing alcohol on 0 days. On a day that patient consumes drinks containing alcohol on average 0 drinks are consumed.      Physical activity: Patient states that she gets 120 minutes of moderate and 0 minutes of vigorous physical activity each week.  Smoking status: Patient states that she has has never smoked .   Quality of life: Over the past 2 weeks patient states that she had little interest or pleasure in doing things: several days. She has been feeling down, depressed or hopeless:several days.   Social Determinants of Health Assessment:   Computer Use: During the last 12 months patient states that she has used any of the following: desktop/laptop, smart phone or tablet/other portable wireless computer: yes.   Internet Use: During the last 12 months, did you or  any member of your household have access to the internet: Yes, by paying a cell phone company or internet service provider.   Food Insecurities: During the last 12 months, where there any times when you were worried that you would run out of food because of a lack of money or other resources: Yes.   Transportation Barriers: During the last 12 months, have you missed a doctor's appointment because of transportation problems: Yes.   Childcare Barriers: If you are currently using childcare services, please identify  the type of services you use. (If not using childcare services, please select "Not applicable"): not applicable. During the last 12 months, have you had any barriers to childcare services such as: not applicable.   Housing: What is your housing situation today: I have housing.   Intimate Partner Violence: During the last 12 months, how often did your partner physically hurt you: never. During the last 12 months, how often did your partner insult you or talk down to you: never.  Medication Adherence: During the last 12 months, did you ever forget to take your medicine: not applicable. During the last 12 months, were you careless ar times about taking your medicine: not applicable. During the last 12 months, when you felt better did you sometimes stop taking your medication: not applicable. During the last 12 months, sometimes if you felt worse when you took your medicine did you stop taking it: not applicable.   Risk reduction and counseling:   Health Coaching:  Spoke with patient about the  daily recommendation for fruits and vegetables. Showed patient what a serving size would look like. Patient consumes at least two servings of heart healthy fish weekly. Patient consumes a variety of whole grain foods. Patient has been walking 2-3 days per week for 60 minutes. Encouraged patient to try and get daily exercise for at least 20-30 minutes.  Goals: Patient will increase fruit and vegetable  intake to one serving (1 cup) daily over the next month. Once current goal is met patient will increase to 2 servings (2 cups) daily.    Navigation:  I will notify patient of lab results. Patient is aware of 2 more health coaching sessions and a follow up. Referred patient through [facility] for counseling services. Provided patient with a shopping experience in the [employer].   Time: 25 minutes

## 2023-02-26 LAB — LIPID PANEL
Chol/HDL Ratio: 2.9 ratio (ref 0.0–4.4)
Cholesterol, Total: 109 mg/dL (ref 100–199)
HDL: 37 mg/dL — ABNORMAL LOW (ref >39)
LDL Chol Calc (NIH): 55 mg/dL (ref 0–99)
Triglycerides: 84 mg/dL (ref 0–149)
VLDL Cholesterol Cal: 17 mg/dL (ref 5–40)

## 2023-02-26 LAB — HEMOGLOBIN A1C
Est. average glucose Bld gHb Est-mCnc: 111 mg/dL
Hgb A1c MFr Bld: 5.5 % (ref 4.8–5.6)

## 2023-02-26 LAB — GLUCOSE, RANDOM: Glucose: 88 mg/dL (ref 70–99)

## 2023-02-27 ENCOUNTER — Telehealth: Payer: Self-pay

## 2023-02-27 NOTE — Telephone Encounter (Signed)
Health coaching 2   interpreter- Natale Lay, [employer]   Labs-109 cholesterol, 55 LDL cholesterol, 84 triglycerides, 37 HDL cholesterol,  hemoglobin A1C , mean plasma glucose   Patient understands and is aware of her lab results.   Goals-  1. Increase healthy fat intake (olive oil, heart healthy fish, avocados, nuts). 2. Daily exercise for 20-30 minutes   Navigation:  Patient is aware of 1 more health coaching sessions and a follow up.   Time- 10 minutes

## 2023-03-29 ENCOUNTER — Ambulatory Visit: Payer: Self-pay

## 2023-04-05 ENCOUNTER — Ambulatory Visit
Admission: RE | Admit: 2023-04-05 | Discharge: 2023-04-05 | Disposition: A | Discharge status: Home / Self Care | Payer: No Typology Code available for payment source | Source: Ambulatory Visit | Attending: Obstetrics and Gynecology | Admitting: Obstetrics and Gynecology

## 2023-04-05 ENCOUNTER — Ambulatory Visit: Payer: Self-pay | Admitting: Hematology and Oncology

## 2023-04-05 VITALS — BP 117/56 | Wt 202.0 lb

## 2023-04-05 DIAGNOSIS — Z1231 Encounter for screening mammogram for malignant neoplasm of breast: Secondary | ICD-10-CM

## 2023-04-05 DIAGNOSIS — Z01419 Encounter for gynecological examination (general) (routine) without abnormal findings: Secondary | ICD-10-CM

## 2023-04-05 DIAGNOSIS — Z1211 Encounter for screening for malignant neoplasm of colon: Secondary | ICD-10-CM

## 2023-04-05 DIAGNOSIS — N6489 Other specified disorders of breast: Secondary | ICD-10-CM

## 2023-04-05 NOTE — Progress Notes (Signed)
[name] is a 47 y.o. [id number] female who presents to [facility] clinic today with no complaints. Follow up left breast asymmetry.   Pap Smear: Pap smear completed today. Last Pap smear was 05/28/17 and was normal. Per patient has no history of an abnormal Pap smear. Last Pap smear result is available in Epic.   Physical exam: Breasts Breasts symmetrical. No skin abnormalities bilateral breasts. No nipple retraction bilateral breasts. No nipple discharge bilateral breasts. No lymphadenopathy. No lumps palpated bilateral breasts.   MS DIGITAL DIAG TOMO BILAT  Result Date: 03/16/2022 CLINICAL DATA:  Patient presents for bilateral diagnostic examination to evaluate a possible asymmetry over the outer midportion of the left breast for which patient never returned for evaluation in January 2022. EXAM: DIGITAL DIAGNOSTIC BILATERAL MAMMOGRAM WITH TOMOSYNTHESIS; ULTRASOUND LEFT BREAST LIMITED TECHNIQUE: Bilateral digital diagnostic mammography and breast tomosynthesis was performed.; Targeted ultrasound examination of the left breast was performed. COMPARISON:  06/15/2020 ACR Breast Density Category b: There are scattered areas of fibroglandular density. FINDINGS: Examination demonstrates no change in the asymmetry over the outer midportion of the left breast likely asymmetric fibroglandular tissue. The mammographic technologist stated there was an indentation in the skin over the upper inner left periareolar region so she placed a BB marker over this area. No underlying focal abnormality in this area of the inner upper left periareolar region. Right breast is unremarkable. Targeted ultrasound is performed, showing no focal abnormality over the outer midportion of the left breast to account for the mammographic asymmetry. Incidental cyst is present at the 3 o'clock position of the left breast 5 cm from the nipple measuring 5 x 6 x 6 mm. IMPRESSION: Stable asymmetry over the outer midportion of  the left breast. This likely represents asymmetric fibroglandular tissue. Incidental 6 mm cyst over the 3 o'clock position of the left breast. RECOMMENDATION: Recommend 1 additional follow-up diagnostic bilateral mammogram in 1 year to document 2 years of stability of the probable benign left breast asymmetry. I have discussed the findings and recommendations with the patient. If applicable, a reminder letter will be sent to the patient regarding the next appointment. BI-RADS CATEGORY  3: Probably benign. Electronically Signed   By: Deborah Higgins M.D.   On: 03/16/2022 14:29  MS DIGITAL SCREENING TOMO BILATERAL  Result Date: 06/16/2020 CLINICAL DATA:  Screening. EXAM: DIGITAL SCREENING BILATERAL MAMMOGRAM WITH TOMO AND CAD COMPARISON:  None. ACR Breast Density Category b: There are scattered areas of fibroglandular density. FINDINGS: In the left breast, a possible asymmetry warrants further evaluation. In the right breast, no findings suspicious for malignancy. Images were processed with CAD. IMPRESSION: Further evaluation is suggested for possible asymmetry in the left breast. RECOMMENDATION: Diagnostic mammogram and possibly ultrasound of the left breast. (Code:[id number]) The patient will be contacted regarding the findings, and additional imaging will be scheduled. BI-RADS CATEGORY  0: Incomplete. Need additional imaging evaluation and/or prior mammograms for comparison. Electronically Signed   By: Sande Brothers M.D.   On: 06/16/2020 08:45        Pelvic/Bimanual Ext Genitalia No lesions, no swelling and no discharge observed on external genitalia.        Vagina Vagina pink and normal texture. No lesions or discharge observed in vagina.        Cervix Cervix is present. Cervix pink and of normal texture. No discharge observed.    Uterus Uterus is present and palpable. Uterus in normal position and normal size.        Adnexae  Bilateral ovaries present and palpable. No tenderness on palpation.          Rectovaginal No rectal exam completed today since patient had no rectal complaints. No skin abnormalities observed on exam.     Smoking History: Patient has never smoked and was not referred to quit line.    Patient Navigation: Patient education provided. Access to services provided for patient through [facility] program. Deborah Higgins interpreter provided. No transportation provided   Colorectal Cancer Screening: Per patient has never had colonoscopy completed No complaints today. FIT test given.    Breast and Cervical Cancer Risk Assessment: Patient does not have family history of breast cancer, known genetic mutations, or radiation treatment to the chest before age 49. Patient does not have history of cervical dysplasia, immunocompromised, or DES exposure in-utero.  Risk Scores as of Encounter on 04/05/2023     Deborah Higgins           5-year 0.61%   Lifetime 6.05%   This patient is [place] but has no documented birth country, so the [place] model used data from [place] patients to calculate their risk score. Document a birth country in the Demographics activity for a more accurate score.         Last calculated by Deborah Higgins, CMA on 04/05/2023 at 11:12 AM        A: B[facility]CP exam with pap smear No complaints with benign exam. Follow up stable left breast asymmetry.  P: Referred patient to the [facility] of [facility] for a diagnostic mammogram. Appointment scheduled 04/05/23.  Deborah Lux, NP 04/05/2023 11:42 AM

## 2023-04-05 NOTE — Patient Instructions (Signed)
Taught Deborah Higgins about self breast awareness and gave educational materials to take home. Patient did not need a Pap smear today due to last Pap smear was in 05/28/2017 per patient. Let her know [facility]CP will cover Pap smears every 5 years unless has a history of abnormal Pap smears. Referred patient to the [facility] of [facility] for diagnostic mammogram. Appointment scheduled for 04/05/2023. Patient aware of appointment and will be there. Let patient know will follow up with her within the next couple weeks with results. Deborah Higgins verbalized understanding.  Pascal Lux, NP 12:01 PM

## 2023-04-12 LAB — CYTOLOGY - PAP
Comment: NEGATIVE
Diagnosis: UNDETERMINED — AB
High risk HPV: NEGATIVE

## 2023-12-25 ENCOUNTER — Telehealth: Payer: Self-pay

## 2023-12-25 NOTE — Telephone Encounter (Signed)
 Health Coaching 3  interpreter- [employer]   Goals-  Patient has been trying to maintain a heart healthy diet. Patient has been trying to consume more fruits and vegetables daily. Patient has been watching the amount of fried and fatty foods consumed. Encouraged patient to try and get 20-30 minutes of daily exercise.       Navigation:  Patient is aware of  a follow up session. Patient is scheduled for FU on 01/21/24.

## 2024-01-21 ENCOUNTER — Inpatient Hospital Stay: Payer: Self-pay

## 2024-01-25 ENCOUNTER — Inpatient Hospital Stay: Payer: Self-pay

## 2024-03-10 ENCOUNTER — Other Ambulatory Visit: Payer: Self-pay | Admitting: Obstetrics and Gynecology

## 2024-03-10 DIAGNOSIS — Z1231 Encounter for screening mammogram for malignant neoplasm of breast: Secondary | ICD-10-CM

## 2024-05-22 ENCOUNTER — Ambulatory Visit
Admission: RE | Admit: 2024-05-22 | Discharge: 2024-05-22 | Disposition: A | Discharge status: Home / Self Care | Payer: Self-pay | Source: Ambulatory Visit | Attending: Obstetrics and Gynecology | Admitting: Obstetrics and Gynecology

## 2024-05-22 ENCOUNTER — Other Ambulatory Visit: Payer: Self-pay

## 2024-05-22 ENCOUNTER — Ambulatory Visit: Payer: Self-pay | Admitting: *Deleted

## 2024-05-22 VITALS — BP 125/60 | Ht 62.0 in | Wt 208.0 lb

## 2024-05-22 DIAGNOSIS — Z01419 Encounter for gynecological examination (general) (routine) without abnormal findings: Secondary | ICD-10-CM

## 2024-05-22 DIAGNOSIS — Z1211 Encounter for screening for malignant neoplasm of colon: Secondary | ICD-10-CM

## 2024-05-22 DIAGNOSIS — Z1239 Encounter for other screening for malignant neoplasm of breast: Secondary | ICD-10-CM

## 2024-05-22 DIAGNOSIS — Z1231 Encounter for screening mammogram for malignant neoplasm of breast: Secondary | ICD-10-CM

## 2024-05-22 NOTE — Patient Instructions (Signed)
 Explained breast self awareness with Deborah Higgins. Pap smear completed today. Let her know that her next Pap smear will be due based on the results of today's Pap smear. Referred patient to the [facility] of [facility] for a screening mammogram on mobile unit. Appointment scheduled Thursday, May 22, 2024 at 1030. Patient aware of appointment and will be there. Let patient know the Breast Center will follow up with her within the next couple weeks with results of mammogram by letter or phone. Deborah Higgins verbalized understanding.  Elbie Statzer, Wanda Ship, RN 10:08 AM

## 2024-05-22 NOTE — Progress Notes (Signed)
 [name] is a 48 y.o. [id number] female who presents to [facility] clinic today with no complaints.    Pap Smear: Pap smear completed today. Last Pap smear was 04/05/2023 at [facility] clinic and was abnormal - ASCUS with negative HPV. Per patient has no history of an abnormal Pap smear prior to her most recent Pap smear. Last Pap smear result is available in Epic.   Physical exam: Breasts Breasts symmetrical. No skin abnormalities bilateral breasts. No nipple retraction bilateral breasts. No nipple discharge bilateral breasts. No lymphadenopathy. No lumps palpated bilateral breasts. No complaints of pain or tenderness on exam.      Pelvic/Bimanual Ext Genitalia No lesions, no swelling and no discharge observed on external genitalia.        Vagina Vagina pink and normal texture. No lesions or discharge observed in vagina.        Cervix Cervix is present. Cervix pink and of normal texture. Scant amount of blood observed at cervical os due to the end of her menstrual cycle.    Uterus Uterus is present and palpable. Uterus in normal position and normal size.        Adnexae Bilateral ovaries present and palpable. No tenderness on palpation.         Rectovaginal No rectal exam completed today since patient had no rectal complaints. No skin abnormalities observed on exam.     Smoking History: Patient has never smoked. FIT Test given to patient to complete. referred to quit line.    Patient Navigation: Patient education provided. Access to services provided for patient through [place] program. Spanish interpreter [name] from [facility] provided.   Colorectal Cancer Screening: Per patient has never had colonoscopy completed. FIT Test given to patient to complete. No complaints today.    Breast and Cervical Cancer Risk Assessment: Patient has family history of great great maternal aunt having breast cancer. Patient has no known genetic mutations or history of radiation  treatment to the chest before age 55. Patient does not have history of cervical dysplasia, immunocompromised, or DES exposure in-utero.  Risk Scores as of Encounter on 05/22/2024     Deborah Higgins           5-year 0.63%   Lifetime 5.96%   This patient is [place]/Latina but has no documented birth country, so the [place] model used data from [place] patients to calculate their risk score. Document a birth country in the Demographics activity for a more accurate score.         Last calculated by Silas, Ansyi K, CMA on 05/22/2024 at 10:17 AM        A: B[facility]CP exam with pap smear No complaints.  P: Referred patient to the [facility] of [facility] for a screening mammogram on mobile unit. Appointment scheduled Thursday, May 22, 2024 at 1030.  Driscilla Wanda SQUIBB, RN 05/22/2024 10:07 AM

## 2024-05-26 ENCOUNTER — Ambulatory Visit: Payer: Self-pay

## 2024-05-26 LAB — CYTOLOGY - PAP
Comment: NEGATIVE
Diagnosis: NEGATIVE
Diagnosis: REACTIVE
High risk HPV: NEGATIVE

## 2024-05-29 ENCOUNTER — Ambulatory Visit: Payer: Self-pay | Admitting: Obstetrics & Gynecology
